# Patient Record
Sex: Male | Born: 1985
Health system: Southern US, Community
[De-identification: ages and names within clinical notes are randomized; demographics above are authoritative.]

## PROBLEM LIST (undated history)

## (undated) DIAGNOSIS — J392 Other diseases of pharynx: Secondary | ICD-10-CM

## (undated) DIAGNOSIS — F17200 Nicotine dependence, unspecified, uncomplicated: Secondary | ICD-10-CM

## (undated) DIAGNOSIS — E559 Vitamin D deficiency, unspecified: Secondary | ICD-10-CM

## (undated) DIAGNOSIS — L0592 Pilonidal sinus without abscess: Secondary | ICD-10-CM

## (undated) DIAGNOSIS — Z8639 Personal history of other endocrine, nutritional and metabolic disease: Secondary | ICD-10-CM

## (undated) DIAGNOSIS — L709 Acne, unspecified: Secondary | ICD-10-CM

## (undated) DIAGNOSIS — E213 Hyperparathyroidism, unspecified: Secondary | ICD-10-CM

## (undated) DIAGNOSIS — S0993XA Unspecified injury of face, initial encounter: Secondary | ICD-10-CM

## (undated) HISTORY — DX: Nicotine dependence, unspecified, uncomplicated: F17.200

## (undated) HISTORY — DX: Hyperparathyroidism, unspecified: E21.3

## (undated) HISTORY — PX: NO PAST SURGERIES: SHX2092

---

## 2013-02-16 ENCOUNTER — Ambulatory Visit: Payer: Medicaid Other | Attending: Internal Medicine | Admitting: Internal Medicine

## 2013-02-16 ENCOUNTER — Encounter: Payer: Self-pay | Admitting: Internal Medicine

## 2013-02-16 VITALS — BP 140/90 | HR 82 | Temp 98.2°F | Resp 17 | Wt 259.8 lb

## 2013-02-16 DIAGNOSIS — J3489 Other specified disorders of nose and nasal sinuses: Secondary | ICD-10-CM

## 2013-02-16 DIAGNOSIS — Z139 Encounter for screening, unspecified: Secondary | ICD-10-CM

## 2013-02-16 DIAGNOSIS — IMO0001 Reserved for inherently not codable concepts without codable children: Secondary | ICD-10-CM

## 2013-02-16 DIAGNOSIS — L709 Acne, unspecified: Secondary | ICD-10-CM

## 2013-02-16 DIAGNOSIS — L708 Other acne: Secondary | ICD-10-CM

## 2013-02-16 DIAGNOSIS — Z23 Encounter for immunization: Secondary | ICD-10-CM

## 2013-02-16 DIAGNOSIS — R030 Elevated blood-pressure reading, without diagnosis of hypertension: Secondary | ICD-10-CM | POA: Insufficient documentation

## 2013-02-16 DIAGNOSIS — R05 Cough: Secondary | ICD-10-CM

## 2013-02-16 DIAGNOSIS — R0981 Nasal congestion: Secondary | ICD-10-CM | POA: Insufficient documentation

## 2013-02-16 DIAGNOSIS — R059 Cough, unspecified: Secondary | ICD-10-CM

## 2013-02-16 MED ORDER — CLINDAMYCIN PHOS-BENZOYL PEROX 1-5 % EX GEL: Freq: Two times a day (BID) | CUTANEOUS | Status: DC

## 2013-02-16 MED ORDER — BENZONATATE 100 MG PO CAPS: 100.0000 mg | ORAL_CAPSULE | Freq: Three times a day (TID) | ORAL | Status: DC | PRN

## 2013-02-16 MED ORDER — FLUTICASONE PROPIONATE 50 MCG/ACT NA SUSP: 2.0000 | Freq: Every day | NASAL | Status: DC

## 2013-02-16 MED ORDER — CLINDAMYCIN PHOS-BENZOYL PEROX 1.2-5 % EX GEL: CUTANEOUS | Status: DC

## 2013-02-16 NOTE — Progress Notes (Signed)
Patient here to establish care Takes no prescribed medications Complains of having acne like rash on his face,top of His head and on his torso. Some of them are painful at times Red and inflamed on his back Currently smokes but is using the patch to try to quit

## 2013-02-16 NOTE — Progress Notes (Signed)
MRN: [id number] Name: Larry Hayden  Sex: male Age: 27 y.o. DOB: 1985/03/23  Allergies: Review of patient's allergies indicates no known allergies.  Chief Complaint  Patient presents with  . new patient    HPI: Patient is 27 y.o. male who comes for the first time to establish medical care, patient reported to have a lot of nasal congestion postnasal drip minimal productive cough, denies any fever chills chest pain or shortness of breath, was a smoker nor using nicotine patch and trying to quit, patient reported to have lot of acne spots on the face.  History reviewed. No pertinent past medical history.  History reviewed. No pertinent past surgical history.    Medication List       This list is accurate as of: 02/16/13 11:41 AM.  Always use your most recent med list.               benzonatate 100 MG capsule  Commonly known as:  TESSALON  Take 1 capsule (100 mg total) by mouth 3 (three) times daily as needed for cough.     Clindamycin-Benzoyl Per (Refr) gel  Apply topically 2 times a day     fluticasone 50 MCG/ACT nasal spray  Commonly known as:  FLONASE  Place 2 sprays into both nostrils daily.        Meds ordered this encounter  Medications  . Clindamycin-Benzoyl Per, Refr, gel    Sig: Apply topically 2 times a day    Dispense:  45 g    Refill:  1  . benzonatate (TESSALON) 100 MG capsule    Sig: Take 1 capsule (100 mg total) by mouth 3 (three) times daily as needed for cough.    Dispense:  30 capsule    Refill:  1  . fluticasone (FLONASE) 50 MCG/ACT nasal spray    Sig: Place 2 sprays into both nostrils daily.    Dispense:  16 g    Refill:  1    Immunization History  Administered Date(s) Administered  . Influenza,inj,Quad PF,36+ Mos 02/16/2013    History  Substance Use Topics  . Smoking status: Current Every Day Smoker    Types: Cigarettes  . Smokeless tobacco: Not on file  . Alcohol Use: No    Review of Systems  As noted in HPI  Filed  Vitals:   02/16/13 1129  BP: 140/90  Pulse:   Temp:   Resp:     Physical Exam  Physical Exam  Constitutional: He is oriented to person, place, and time. No distress.  HENT:  Nasal congestion no sinus tenderness, minimal pharyngeal erythema no exudate   Acne spots on the face erythematous no apparent discharge  Cardiovascular: Normal rate and regular rhythm.   Pulmonary/Chest: Breath sounds normal. No respiratory distress. He has no wheezes. He has no rales.  Neurological: He is alert and oriented to person, place, and time.    CBC No results found for this basename: wbc,  rbc,  hgb,  hct,  plt,  mcv,  neutrabs,  lymphsabs,  monoabs,  eosabs,  basosabs    CMP  No results found for this basename: na,  k,  cl,  co2,  glucose,  bun,  creatinine,  calcium,  prot,  albumin,  ast,  alt,  alkphos,  bilitot,  gfrnonaa,  gfraa    No results found for this basename: chol,  tri,  ldl    No components found with this basename: hga1c    No results found for this  basename: AST    Assessment and Plan  Elevated BP   manual BP is 140/90, I have advised patient for low salt diet and exercise to lose weight will check on following visit if persistently elevated consider starting on blood pressure medication.   flu shot given today  Screening - Plan: CBC with Differential, COMPLETE METABOLIC PANEL WITH GFR, Lipid panel, TSH, Vit D  25 hydroxy (rtn osteoporosis monitoring)  Acne - Plan: Clindamycin-Benzoyl Per, Refr, gel  Stuffy nose - Plan: fluticasone (FLONASE) 50 MCG/ACT nasal spray  Cough - Plan: benzonatate (TESSALON) 100 MG capsule. Advised patient for saltwater gargles    Health Maintenance Flu shot given today  Return in about 6 weeks (around 03/30/2013).  Doris Cheadle, MD

## 2013-02-16 NOTE — Patient Instructions (Signed)

## 2013-03-08 DIAGNOSIS — Z8639 Personal history of other endocrine, nutritional and metabolic disease: Secondary | ICD-10-CM

## 2013-03-08 HISTORY — DX: Personal history of other endocrine, nutritional and metabolic disease: Z86.39

## 2013-03-26 ENCOUNTER — Other Ambulatory Visit: Payer: Self-pay

## 2013-03-27 ENCOUNTER — Ambulatory Visit: Payer: Medicaid Other | Attending: Internal Medicine

## 2013-03-27 DIAGNOSIS — Z139 Encounter for screening, unspecified: Secondary | ICD-10-CM

## 2013-03-27 LAB — LIPID PANEL
CHOL/HDL RATIO: 6.8 ratio
CHOLESTEROL: 210 mg/dL — AB (ref 0–200)
HDL: 31 mg/dL — AB (ref >39)
LDL CALC: 150 mg/dL — AB (ref 0–99)
Triglycerides: 146 mg/dL (ref <150)
VLDL: 29 mg/dL (ref 0–40)

## 2013-03-27 LAB — CBC WITH DIFFERENTIAL/PLATELET
BASOS ABS: 0.1 10*3/uL (ref 0.0–0.1)
Basophils Relative: 1 % (ref 0–1)
Eosinophils Absolute: 0.2 10*3/uL (ref 0.0–0.7)
Eosinophils Relative: 2 % (ref 0–5)
HCT: 47 % (ref 39.0–52.0)
Hemoglobin: 16.2 g/dL (ref 13.0–17.0)
LYMPHS PCT: 30 % (ref 12–46)
Lymphs Abs: 2.4 10*3/uL (ref 0.7–4.0)
MCH: 27.9 pg (ref 26.0–34.0)
MCHC: 34.5 g/dL (ref 30.0–36.0)
MCV: 81 fL (ref 78.0–100.0)
Monocytes Absolute: 0.6 10*3/uL (ref 0.1–1.0)
Monocytes Relative: 8 % (ref 3–12)
NEUTROS PCT: 59 % (ref 43–77)
Neutro Abs: 4.9 10*3/uL (ref 1.7–7.7)
PLATELETS: 254 10*3/uL (ref 150–400)
RBC: 5.8 MIL/uL (ref 4.22–5.81)
RDW: 13.8 % (ref 11.5–15.5)
WBC: 8.2 10*3/uL (ref 4.0–10.5)

## 2013-03-27 LAB — COMPLETE METABOLIC PANEL WITH GFR
ALK PHOS: 67 U/L (ref 39–117)
ALT: 61 U/L — AB (ref 0–53)
AST: 24 U/L (ref 0–37)
Albumin: 4.5 g/dL (ref 3.5–5.2)
BILIRUBIN TOTAL: 0.9 mg/dL (ref 0.3–1.2)
BUN: 14 mg/dL (ref 6–23)
CO2: 28 mEq/L (ref 19–32)
CREATININE: 0.87 mg/dL (ref 0.50–1.35)
Calcium: 10.7 mg/dL — ABNORMAL HIGH (ref 8.4–10.5)
Chloride: 103 mEq/L (ref 96–112)
GFR, Est African American: >89 mL/min
GFR, Est Non African American: >89 mL/min
Glucose, Bld: 99 mg/dL (ref 70–99)
Potassium: 4.1 mEq/L (ref 3.5–5.3)
Sodium: 135 mEq/L (ref 135–145)
Total Protein: 6.9 g/dL (ref 6.0–8.3)

## 2013-03-27 LAB — TSH: TSH: 2.16 u[IU]/mL (ref 0.350–4.500)

## 2013-03-28 ENCOUNTER — Telehealth: Payer: Self-pay | Admitting: *Deleted

## 2013-03-28 ENCOUNTER — Telehealth: Payer: Self-pay

## 2013-03-28 LAB — VITAMIN D 25 HYDROXY (VIT D DEFICIENCY, FRACTURES): Vit D, 25-Hydroxy: 21 ng/mL — ABNORMAL LOW (ref 30–89)

## 2013-03-28 MED ORDER — VITAMIN D (ERGOCALCIFEROL) 1.25 MG (50000 UNIT) PO CAPS: 50000.0000 [IU] | ORAL_CAPSULE | ORAL | Status: DC

## 2013-03-28 NOTE — Telephone Encounter (Signed)
Message copied by Lestine MountJUAREZ, Izael Bessinger L on Wed Mar 28, 2013  3:16 PM ------      Message from: Doris CheadleADVANI, DEEPAK      Created: Wed Mar 28, 2013 10:26 AM       Blood work reviewed, noticed low vitamin D, call patient advise to start ergocalciferol 50,000 units once a week for the duration of  12 weeks.      Blood work reviewed, noticed elevated cholesterol, advise patient for low fat diet.       ------

## 2013-03-28 NOTE — Telephone Encounter (Signed)
Pt complains of weakness in the body and severe headaches causing pt to be unable to open his eyes. Has an appointment scheduled for 03/30/13.

## 2013-03-28 NOTE — Telephone Encounter (Signed)
Message copied by Catie Chiao, [place] R on Wed Mar 28, 2013 10:43 AM ------      Message from: Doris CheadleADVANI, DEEPAK      Created: Wed Mar 28, 2013 10:26 AM       Blood work reviewed, noticed low vitamin D, call patient advise to start ergocalciferol 50,000 units once a week for the duration of  12 weeks.      Blood work reviewed, noticed elevated cholesterol, advise patient for low fat diet.       ------

## 2013-03-28 NOTE — Telephone Encounter (Signed)
Interpreter line used Patient is aware of his lab results [facility]

## 2013-03-30 ENCOUNTER — Other Ambulatory Visit: Payer: Self-pay

## 2013-03-30 ENCOUNTER — Ambulatory Visit: Payer: Medicaid Other | Attending: Internal Medicine | Admitting: Internal Medicine

## 2013-03-30 ENCOUNTER — Encounter: Payer: Self-pay | Admitting: Internal Medicine

## 2013-03-30 VITALS — BP 130/89 | HR 90 | Temp 98.3°F | Resp 16

## 2013-03-30 DIAGNOSIS — R059 Cough, unspecified: Secondary | ICD-10-CM

## 2013-03-30 DIAGNOSIS — L709 Acne, unspecified: Secondary | ICD-10-CM

## 2013-03-30 DIAGNOSIS — E785 Hyperlipidemia, unspecified: Secondary | ICD-10-CM

## 2013-03-30 DIAGNOSIS — J069 Acute upper respiratory infection, unspecified: Secondary | ICD-10-CM

## 2013-03-30 DIAGNOSIS — R030 Elevated blood-pressure reading, without diagnosis of hypertension: Secondary | ICD-10-CM

## 2013-03-30 DIAGNOSIS — F17200 Nicotine dependence, unspecified, uncomplicated: Secondary | ICD-10-CM

## 2013-03-30 DIAGNOSIS — IMO0001 Reserved for inherently not codable concepts without codable children: Secondary | ICD-10-CM

## 2013-03-30 DIAGNOSIS — L708 Other acne: Secondary | ICD-10-CM

## 2013-03-30 DIAGNOSIS — R05 Cough: Secondary | ICD-10-CM

## 2013-03-30 DIAGNOSIS — E559 Vitamin D deficiency, unspecified: Secondary | ICD-10-CM

## 2013-03-30 MED ORDER — BENZONATATE 100 MG PO CAPS: 100.0000 mg | ORAL_CAPSULE | Freq: Three times a day (TID) | ORAL | Status: DC | PRN

## 2013-03-30 MED ORDER — AZITHROMYCIN 250 MG PO TABS: ORAL_TABLET | ORAL | Status: DC

## 2013-03-30 MED ORDER — NICOTINE POLACRILEX 4 MG MT GUM: 4.0000 mg | CHEWING_GUM | OROMUCOSAL | Status: DC | PRN

## 2013-03-30 MED ORDER — CLINDAMYCIN PHOS-BENZOYL PEROX 1-5 % EX GEL: Freq: Two times a day (BID) | CUTANEOUS | Status: DC

## 2013-03-30 NOTE — Progress Notes (Signed)
Patient here with interpreter Follow up from his acne

## 2013-03-30 NOTE — Progress Notes (Signed)
MRN: [id number] Name: Larry Hayden  Sex: male Age: 28 y.o. DOB: September 07, 1985  Allergies: Review of patient's allergies indicates no known allergies.  Chief Complaint  Patient presents with  . Follow-up    HPI: Patient is 28 y.o. male who comes today for followup, had a blood work done which was reviewed with the patient noticed low vitamin D and hyperlipidemia, patient has already been prescribed vitamin D supplement which he is going to start, has history of acne has been applying her Benzaclin, reports improvement and is requesting refill on the medication, also patient has been having ongoing nasal congestion dry cough, headache, denies any chest pain or shortness of breath, still smokes cigarettes, could not tolerate nicotine patch requesting nicotine gum prescription. Patient denies any fever chills.  History reviewed. No pertinent past medical history.  History reviewed. No pertinent past surgical history.    Medication List       This list is accurate as of: 03/30/13 11:00 AM.  Always use your most recent med list.               azithromycin 250 MG tablet  Commonly known as:  ZITHROMAX Z-PAK  Take as directed     benzonatate 100 MG capsule  Commonly known as:  TESSALON  Take 1 capsule (100 mg total) by mouth 3 (three) times daily as needed for cough.     clindamycin-benzoyl peroxide gel  Commonly known as:  BENZACLIN  Apply topically 2 (two) times daily.     fluticasone 50 MCG/ACT nasal spray  Commonly known as:  FLONASE  Place 2 sprays into both nostrils daily.     nicotine polacrilex 4 MG gum  Commonly known as:  NICORETTE  Take 1 each (4 mg total) by mouth as needed for smoking cessation.     Vitamin D (Ergocalciferol) 50000 UNITS Caps capsule  Commonly known as:  DRISDOL  Take 1 capsule (50,000 Units total) by mouth every 7 (seven) days.        Meds ordered this encounter  Medications  . azithromycin (ZITHROMAX Z-PAK) 250 MG tablet    Sig: Take as  directed    Dispense:  6 each    Refill:  0  . nicotine polacrilex (NICORETTE) 4 MG gum    Sig: Take 1 each (4 mg total) by mouth as needed for smoking cessation.    Dispense:  100 tablet    Refill:  0  . clindamycin-benzoyl peroxide (BENZACLIN) gel    Sig: Apply topically 2 (two) times daily.    Dispense:  50 g    Refill:  2  . benzonatate (TESSALON) 100 MG capsule    Sig: Take 1 capsule (100 mg total) by mouth 3 (three) times daily as needed for cough.    Dispense:  30 capsule    Refill:  1    Immunization History  Administered Date(s) Administered  . Influenza,inj,Quad PF,36+ Mos 02/16/2013    Family History  Problem Relation Age of Onset  . Cancer Mother   . Cancer Maternal Uncle   . Heart disease Maternal Grandmother     History  Substance Use Topics  . Smoking status: Current Every Day Smoker    Types: Cigarettes  . Smokeless tobacco: Not on file  . Alcohol Use: No    Review of Systems  As noted in HPI  Filed Vitals:   03/30/13 1046  BP: 130/89  Pulse: 90  Temp: 98.3 F (36.8 C)  Resp: 16  Physical Exam  Physical Exam  HENT:  Nasal congestion minimal sinus tenderness  Eyes: EOM are normal. Pupils are equal, round, and reactive to light.  Cardiovascular: Normal rate and regular rhythm.   Pulmonary/Chest: Breath sounds normal. No respiratory distress. He has no wheezes. He has no rales.  Musculoskeletal: He exhibits no edema.  Lymphadenopathy:    He has cervical adenopathy.  Skin:  spots on the face erythematous no apparent discharge      CBC    Component Value Date/Time   WBC 8.2 03/27/2013 1130   RBC 5.80 03/27/2013 1130   HGB 16.2 03/27/2013 1130   HCT 47.0 03/27/2013 1130   PLT 254 03/27/2013 1130   MCV 81.0 03/27/2013 1130   LYMPHSABS 2.4 03/27/2013 1130   MONOABS 0.6 03/27/2013 1130   EOSABS 0.2 03/27/2013 1130   BASOSABS 0.1 03/27/2013 1130    CMP     Component Value Date/Time   NA 135 03/27/2013 1130   K 4.1 03/27/2013 1130   CL  103 03/27/2013 1130   CO2 28 03/27/2013 1130   GLUCOSE 99 03/27/2013 1130   BUN 14 03/27/2013 1130   CREATININE 0.87 03/27/2013 1130   CALCIUM 10.7* 03/27/2013 1130   PROT 6.9 03/27/2013 1130   ALBUMIN 4.5 03/27/2013 1130   AST 24 03/27/2013 1130   ALT 61* 03/27/2013 1130   ALKPHOS 67 03/27/2013 1130   BILITOT 0.9 03/27/2013 1130    Lab Results  Component Value Date/Time   CHOL 210* 03/27/2013 11:30 AM    No components found with this basename: hga1c    Lab Results  Component Value Date/Time   AST 24 03/27/2013 11:30 AM    Assessment and Plan  Elevated BP  Blood pressure is improved compared to last visit continue with low salt diet.  Acne - Plan: clindamycin-benzoyl peroxide (BENZACLIN) gel  Unspecified vitamin D deficiency Vitamin D 50,000 units q. Weekly.  Other and unspecified hyperlipidemia Advised patient for low fat diet.  URI (upper respiratory infection) - Plan: azithromycin (ZITHROMAX Z-PAK) 250 MG tablet Also advised for salt water gargles  Cough - Plan: benzonatate (TESSALON) 100 MG capsule  Smoking - Plan: nicotine polacrilex (NICORETTE) 4 MG gum    Return in about 3 months (around 06/28/2013).  Doris Cheadle, MD

## 2013-04-13 ENCOUNTER — Encounter (HOSPITAL_COMMUNITY): Payer: Self-pay | Admitting: Emergency Medicine

## 2013-04-13 ENCOUNTER — Emergency Department (HOSPITAL_COMMUNITY)
Admission: EM | Admit: 2013-04-13 | Discharge: 2013-04-13 | Disposition: A | Discharge status: Home / Self Care | Payer: Medicaid Other | Attending: Emergency Medicine | Admitting: Emergency Medicine

## 2013-04-13 DIAGNOSIS — Z792 Long term (current) use of antibiotics: Secondary | ICD-10-CM | POA: Insufficient documentation

## 2013-04-13 DIAGNOSIS — L03317 Cellulitis of buttock: Principal | ICD-10-CM

## 2013-04-13 DIAGNOSIS — IMO0002 Reserved for concepts with insufficient information to code with codable children: Secondary | ICD-10-CM | POA: Insufficient documentation

## 2013-04-13 DIAGNOSIS — Z79899 Other long term (current) drug therapy: Secondary | ICD-10-CM | POA: Insufficient documentation

## 2013-04-13 DIAGNOSIS — F17200 Nicotine dependence, unspecified, uncomplicated: Secondary | ICD-10-CM | POA: Insufficient documentation

## 2013-04-13 DIAGNOSIS — L0231 Cutaneous abscess of buttock: Secondary | ICD-10-CM | POA: Insufficient documentation

## 2013-04-13 MED ORDER — CEPHALEXIN 500 MG PO CAPS: 500.0000 mg | ORAL_CAPSULE | Freq: Four times a day (QID) | ORAL | Status: DC

## 2013-04-13 MED ORDER — SULFAMETHOXAZOLE-TRIMETHOPRIM 800-160 MG PO TABS: 1.0000 | ORAL_TABLET | Freq: Two times a day (BID) | ORAL | Status: AC

## 2013-04-13 NOTE — Discharge Instructions (Signed)
.         .             .          .        .      Marland Kitchen                   .                .    Marland Kitchen                   Marland Kitchen                    .              .     (  )            .                   Marland Kitchen               .              . :         .           .         (  )     .               Marland Kitchen   CARE                      .           Marland Kitchen          Marland Kitchen             .    :        .    .           .     .         .          .    :             .           .   .   :   .   .              .   : 2004/12/02   : 2011/08/24    : 2011/05/07 ExitCare    2014   Take antibiotic to completion. Followup with your primary care physician on Monday.   Abscess An abscess is an infected area that contains a collection of pus and debris.It can occur in almost any part of the body. An abscess is also known as a furuncle or boil. CAUSES  An abscess occurs when tissue gets infected. This can occur from blockage of oil or sweat glands, infection of hair follicles, or a minor injury to the skin. As the body tries to fight the infection, pus collects in the area and creates pressure under the skin. This pressure causes pain. People with weakened immune systems have difficulty fighting infections and get certain abscesses more often.  SYMPTOMS Usually an abscess develops on the skin and becomes a painful mass that is red, warm, and tender. If the abscess forms under the skin, you may feel a moveable soft area under the skin. Some  abscesses break open (rupture) on their own, but most will continue to get worse without care. The infection can spread deeper into the body and eventually into the bloodstream, causing you to feel ill.  DIAGNOSIS  Your caregiver will take your medical history and perform a physical exam. A sample of fluid may also be taken from the abscess to determine what is causing your infection. TREATMENT  Your caregiver may prescribe antibiotic medicines to fight the infection. However, taking antibiotics alone usually does not cure an abscess. Your caregiver may need to make a small cut (incision) in the abscess to drain the pus. In  some cases, gauze is packed into the abscess to reduce pain and to continue draining the area. HOME CARE INSTRUCTIONS   Only take over-the-counter or prescription medicines for pain, discomfort, or fever as directed by your caregiver.  If you were prescribed antibiotics, take them as directed. Finish them even if you start to feel better.  If gauze is used, follow your caregiver's directions for changing the gauze.  To avoid spreading the infection:  Keep your draining abscess covered with a bandage.  Wash your hands well.  Do not share personal care items, towels, or whirlpools with others.  Avoid skin contact with others.  Keep your skin and clothes clean around the abscess.  Keep all follow-up appointments as directed by your caregiver. SEEK MEDICAL CARE IF:   You have increased pain, swelling, redness, fluid drainage, or bleeding.  You have muscle aches, chills, or a general ill feeling.  You have a fever. MAKE SURE YOU:   Understand these instructions.  Will watch your condition.  Will get help right away if you are not doing well or get worse. Document Released: 12/02/2004 Document Revised: 08/24/2011 Document Reviewed: 05/07/2011 [facility] Patient Information 2014 [place], [place].

## 2013-04-13 NOTE — ED Notes (Signed)
Small abscess to left medial buttock x 3 days, no drainage noted.

## 2013-04-13 NOTE — ED Provider Notes (Signed)
CSN: [id number]     Arrival date & time 04/13/13  1406 History  This chart was scribed for Johnnette Gourdobyn Albert, non-physician practitioner, working with Layla MawKristen N Ward, DO by Bennett Scrapehristina Taylor, ED Scribe. This patient was seen in room TR11C/TR11C and the patient's care was started at 2:38 PM.    Chief Complaint  Patient presents with  . Abscess    The history is provided by the patient and a relative (sister translated in Arabic in the room). No language interpreter was used.    HPI Comments: Larry Hayden is a 28 y.o. male who presents to the Emergency Department complaining of an abscess to the top of the left buttocks area that has been worsening since its onset 3 days ago. He reports associated pain and warmth to the area. He denies any recent fevers or drainage.    History reviewed. No pertinent past medical history. History reviewed. No pertinent past surgical history. Family History  Problem Relation Age of Onset  . Cancer Mother   . Cancer Maternal Uncle   . Heart disease Maternal Grandmother    History  Substance Use Topics  . Smoking status: Current Every Day Smoker    Types: Cigarettes  . Smokeless tobacco: Not on file  . Alcohol Use: No    Review of Systems  A complete 10 system review of systems was obtained and all systems are negative except as noted in the HPI and PMH.   Allergies  Review of patient's allergies indicates no known allergies.  Home Medications   Current Outpatient Rx  Name  Route  Sig  Dispense  Refill  . azithromycin (ZITHROMAX Z-PAK) 250 MG tablet      Take as directed   6 each   0   . benzonatate (TESSALON) 100 MG capsule   Oral   Take 1 capsule (100 mg total) by mouth 3 (three) times daily as needed for cough.   30 capsule   1   . cephALEXin (KEFLEX) 500 MG capsule   Oral   Take 1 capsule (500 mg total) by mouth 4 (four) times daily.   28 capsule   0   . clindamycin-benzoyl peroxide (BENZACLIN) gel   Topical   Apply topically  2 (two) times daily.   50 g   2   . fluticasone (FLONASE) 50 MCG/ACT nasal spray   Each Nare   Place 2 sprays into both nostrils daily.   16 g   1   . nicotine polacrilex (NICORETTE) 4 MG gum   Oral   Take 1 each (4 mg total) by mouth as needed for smoking cessation.   100 tablet   0   . sulfamethoxazole-trimethoprim (BACTRIM DS,SEPTRA DS) 800-160 MG per tablet   Oral   Take 1 tablet by mouth 2 (two) times daily.   14 tablet   0   . Vitamin D, Ergocalciferol, (DRISDOL) 50000 UNITS CAPS capsule   Oral   Take 1 capsule (50,000 Units total) by mouth every 7 (seven) days.   30 capsule   0    Triage Vitals: BP 148/83  Pulse 82  Temp(Src) 97.9 F (36.6 C) (Oral)  Resp 20  SpO2 96%  Physical Exam  Nursing note and vitals reviewed. Constitutional: He is oriented to person, place, and time. He appears well-developed and well-nourished. No distress.  HENT:  Head: Normocephalic and atraumatic.  Eyes: EOM are normal.  Neck: Neck supple. No tracheal deviation present.  Cardiovascular: Normal rate.   Pulmonary/Chest:  Effort normal. No respiratory distress.  Genitourinary:  2 cm area of induration, erythema and warmth to the top of the left buttocks. No drainage. Chaperone (scribe) present  Musculoskeletal: Normal range of motion.  Neurological: He is alert and oriented to person, place, and time.  Skin: Skin is warm and dry.  Psychiatric: He has a normal mood and affect. His behavior is normal.    ED Course  Procedures (including critical care time)  DIAGNOSTIC STUDIES: Oxygen Saturation is 96% on RA, adequate by my interpretation.    COORDINATION OF CARE: 2:39 PM-Discussed treatment plan which includes incision and drainage with pt at bedside and pt agreed to plan.   INCISION AND DRAINAGE PROCEDURE NOTE: Patient identification was confirmed and verbal consent was obtained. This procedure was performed by Johnnette Gourd, PA-C at 2:40 PM. Site: left gluteal  crease Sterile procedures observed Needle size: 27 gauge  Anesthetic used (type and amt): 1.5 mL of 1% lidocaine with epi Blade size: 11  Drainage: copious amount of purulent drainage Complexity: Complex No Packing used Site anesthetized, incision made over site, wound drained and explored loculations, rinsed with copious amounts of normal saline, wound packed with sterile gauze, covered with dry, sterile dressing.  Pt tolerated procedure well without complications.  Instructions for care discussed verbally and pt provided with additional written instructions for homecare and f/u. Pt will be started on an antibiotic and advised to have the area checked in 2 days by his PCP.   Labs Review Labs Reviewed - No data to display Imaging Review No results found.  EKG Interpretation   None       MDM   1. Abscess of buttock, left    Pt well appearing, afebrile, VSS. Abscess drained, mild surrounding cellulitis. Will tx with abx. F/u with PCP. Return precautions given. Patient states understanding of treatment care plan and is agreeable.   I personally performed the services described in this documentation, which was scribed in my presence. The recorded information has been reviewed and is accurate.    Trevor Mace, PA-C 04/13/13 1453

## 2013-04-13 NOTE — ED Provider Notes (Signed)
Medical screening examination/treatment/procedure(s) were performed by non-physician practitioner and as supervising physician I was immediately available for consultation/collaboration.  EKG Interpretation   None         Kristen N Ward, DO 04/13/13 1454 

## 2013-04-13 NOTE — ED Notes (Signed)
Pt in c/o abscess to top of buttocks area, states it has been there x3 days, denies drainage, area warm to touch

## 2013-04-23 ENCOUNTER — Ambulatory Visit: Payer: Medicaid Other | Admitting: Internal Medicine

## 2013-05-01 ENCOUNTER — Ambulatory Visit: Payer: Medicaid Other | Admitting: Internal Medicine

## 2013-05-11 ENCOUNTER — Ambulatory Visit: Payer: Medicaid Other | Attending: Internal Medicine | Admitting: Internal Medicine

## 2013-05-11 ENCOUNTER — Encounter: Payer: Self-pay | Admitting: Internal Medicine

## 2013-05-11 VITALS — BP 129/84 | HR 79 | Temp 99.0°F | Resp 16 | Wt 257.0 lb

## 2013-05-11 DIAGNOSIS — M62830 Muscle spasm of back: Secondary | ICD-10-CM

## 2013-05-11 DIAGNOSIS — J3489 Other specified disorders of nose and nasal sinuses: Secondary | ICD-10-CM

## 2013-05-11 DIAGNOSIS — Z79899 Other long term (current) drug therapy: Secondary | ICD-10-CM | POA: Insufficient documentation

## 2013-05-11 DIAGNOSIS — Z09 Encounter for follow-up examination after completed treatment for conditions other than malignant neoplasm: Secondary | ICD-10-CM | POA: Insufficient documentation

## 2013-05-11 DIAGNOSIS — J069 Acute upper respiratory infection, unspecified: Secondary | ICD-10-CM

## 2013-05-11 DIAGNOSIS — R0981 Nasal congestion: Secondary | ICD-10-CM

## 2013-05-11 DIAGNOSIS — F17200 Nicotine dependence, unspecified, uncomplicated: Secondary | ICD-10-CM | POA: Insufficient documentation

## 2013-05-11 DIAGNOSIS — L0231 Cutaneous abscess of buttock: Secondary | ICD-10-CM | POA: Insufficient documentation

## 2013-05-11 DIAGNOSIS — M5380 Other specified dorsopathies, site unspecified: Secondary | ICD-10-CM | POA: Insufficient documentation

## 2013-05-11 DIAGNOSIS — L03317 Cellulitis of buttock: Secondary | ICD-10-CM

## 2013-05-11 DIAGNOSIS — R05 Cough: Secondary | ICD-10-CM

## 2013-05-11 DIAGNOSIS — R059 Cough, unspecified: Secondary | ICD-10-CM

## 2013-05-11 MED ORDER — AMOXICILLIN-POT CLAVULANATE 875-125 MG PO TABS: 1.0000 | ORAL_TABLET | Freq: Two times a day (BID) | ORAL | Status: DC

## 2013-05-11 MED ORDER — CYCLOBENZAPRINE HCL 10 MG PO TABS: 10.0000 mg | ORAL_TABLET | Freq: Three times a day (TID) | ORAL | Status: DC | PRN

## 2013-05-11 MED ORDER — VARENICLINE TARTRATE 0.5 MG X 11 & 1 MG X 42 PO MISC: ORAL | Status: DC

## 2013-05-11 MED ORDER — FLUTICASONE PROPIONATE 50 MCG/ACT NA SUSP: 2.0000 | Freq: Every day | NASAL | Status: DC

## 2013-05-11 MED ORDER — BENZONATATE 100 MG PO CAPS: 100.0000 mg | ORAL_CAPSULE | Freq: Three times a day (TID) | ORAL | Status: DC | PRN

## 2013-05-11 NOTE — Progress Notes (Signed)
Patient here for follow up from ED Was seen for cyst to the buttocks area Complains of back spasms Tremors in arms Chest congestion  With some SOB Coughing up green mucous

## 2013-05-11 NOTE — Progress Notes (Signed)
MRN: [id number] Name: Larry Hayden  Sex: male Age: 28 y.o. DOB: Dec 23, 1985  Allergies: Review of patient's allergies indicates no known allergies.  Chief Complaint  Patient presents with  . Follow-up    HPI: Patient is 28 y.o. male who comes today for followup, last month he went to the emergency room, EMR reviewed he had abscess on the left buttock which was drained and patient was started on Keflex he took it for one week denies any more discharge has some discomfort around that area, he also reported to have lot of upper respiratory symptoms stuffy nose runny nose postnasal drip productive cough greenish in color, patient does smoke cigarettes has been trying to quit could not tolerate nicotine patch and gum, patient wants to quit smoking, he also reported to have lot of back pain, denies any fall or trauma.  History reviewed. No pertinent past medical history.  History reviewed. No pertinent past surgical history.    Medication List       This list is accurate as of: 05/11/13 10:19 AM.  Always use your most recent med list.               amoxicillin-clavulanate 875-125 MG per tablet  Commonly known as:  AUGMENTIN  Take 1 tablet by mouth 2 (two) times daily.     azithromycin 250 MG tablet  Commonly known as:  ZITHROMAX Z-PAK  Take as directed     benzonatate 100 MG capsule  Commonly known as:  TESSALON  Take 1 capsule (100 mg total) by mouth 3 (three) times daily as needed for cough.     cephALEXin 500 MG capsule  Commonly known as:  KEFLEX  Take 1 capsule (500 mg total) by mouth 4 (four) times daily.     clindamycin-benzoyl peroxide gel  Commonly known as:  BENZACLIN  Apply topically 2 (two) times daily.     cyclobenzaprine 10 MG tablet  Commonly known as:  FLEXERIL  Take 1 tablet (10 mg total) by mouth 3 (three) times daily as needed for muscle spasms.     fluticasone 50 MCG/ACT nasal spray  Commonly known as:  FLONASE  Place 2 sprays into both  nostrils daily.     nicotine polacrilex 4 MG gum  Commonly known as:  NICORETTE  Take 1 each (4 mg total) by mouth as needed for smoking cessation.     varenicline 0.5 MG X 11 & 1 MG X 42 tablet  Commonly known as:  CHANTIX STARTING MONTH PAK  Take one 0.5 mg tablet by mouth once daily for 3 days, then increase to one 0.5 mg tablet twice daily for 4 days, then increase to one 1 mg tablet twice daily.     Vitamin D (Ergocalciferol) 50000 UNITS Caps capsule  Commonly known as:  DRISDOL  Take 1 capsule (50,000 Units total) by mouth every 7 (seven) days.        Meds ordered this encounter  Medications  . amoxicillin-clavulanate (AUGMENTIN) 875-125 MG per tablet    Sig: Take 1 tablet by mouth 2 (two) times daily.    Dispense:  20 tablet    Refill:  0  . varenicline (CHANTIX STARTING MONTH PAK) 0.5 MG X 11 & 1 MG X 42 tablet    Sig: Take one 0.5 mg tablet by mouth once daily for 3 days, then increase to one 0.5 mg tablet twice daily for 4 days, then increase to one 1 mg tablet twice daily.  Dispense:  53 tablet    Refill:  0  . benzonatate (TESSALON) 100 MG capsule    Sig: Take 1 capsule (100 mg total) by mouth 3 (three) times daily as needed for cough.    Dispense:  30 capsule    Refill:  1  . fluticasone (FLONASE) 50 MCG/ACT nasal spray    Sig: Place 2 sprays into both nostrils daily.    Dispense:  16 g    Refill:  1  . cyclobenzaprine (FLEXERIL) 10 MG tablet    Sig: Take 1 tablet (10 mg total) by mouth 3 (three) times daily as needed for muscle spasms.    Dispense:  30 tablet    Refill:  3    Immunization History  Administered Date(s) Administered  . Influenza,inj,Quad PF,36+ Mos 02/16/2013    Family History  Problem Relation Age of Onset  . Cancer Mother   . Cancer Maternal Uncle   . Heart disease Maternal Grandmother     History  Substance Use Topics  . Smoking status: Current Every Day Smoker    Types: Cigarettes  . Smokeless tobacco: Not on file  . Alcohol  Use: No    Review of Systems   As noted in HPI  Filed Vitals:   05/11/13 0945  BP: 129/84  Pulse: 79  Temp: 99 F (37.2 C)  Resp: 16    Physical Exam  Physical Exam  HENT:  Nasal congestion ? Polyp , minimal pharyngeal erythema no exudate   Eyes: EOM are normal. Pupils are equal, round, and reactive to light.  Cardiovascular: Normal rate and regular rhythm.   Pulmonary/Chest: Breath sounds normal. No respiratory distress. He has no wheezes. He has no rales.  Musculoskeletal:  Lower paraspinal tenderness   Skin:  Left buttock incision site is clean non-tender no apparent discharge healing well    CBC    Component Value Date/Time   WBC 8.2 03/27/2013 1130   RBC 5.80 03/27/2013 1130   HGB 16.2 03/27/2013 1130   HCT 47.0 03/27/2013 1130   PLT 254 03/27/2013 1130   MCV 81.0 03/27/2013 1130   LYMPHSABS 2.4 03/27/2013 1130   MONOABS 0.6 03/27/2013 1130   EOSABS 0.2 03/27/2013 1130   BASOSABS 0.1 03/27/2013 1130    CMP     Component Value Date/Time   NA 135 03/27/2013 1130   K 4.1 03/27/2013 1130   CL 103 03/27/2013 1130   CO2 28 03/27/2013 1130   GLUCOSE 99 03/27/2013 1130   BUN 14 03/27/2013 1130   CREATININE 0.87 03/27/2013 1130   CALCIUM 10.7* 03/27/2013 1130   PROT 6.9 03/27/2013 1130   ALBUMIN 4.5 03/27/2013 1130   AST 24 03/27/2013 1130   ALT 61* 03/27/2013 1130   ALKPHOS 67 03/27/2013 1130   BILITOT 0.9 03/27/2013 1130    Lab Results  Component Value Date/Time   CHOL 210* 03/27/2013 11:30 AM    No components found with this basename: hga1c    Lab Results  Component Value Date/Time   AST 24 03/27/2013 11:30 AM    Assessment and Plan  Follow up  Abscess of buttock, left Incision and drainage done  in the ER  URI (upper respiratory infection) - Plan: amoxicillin-clavulanate (AUGMENTIN) 875-125 MG per tablet  Stuffy nose - Plan: fluticasone (FLONASE) 50 MCG/ACT nasal spray  Cough - Plan: benzonatate (TESSALON) 100 MG capsule  Back muscle spasm - Plan:  cyclobenzaprine (FLEXERIL) 10 MG tablet  Tobacco use disorder - Plan: varenicline (CHANTIX STARTING MONTH  PAK) 0.5 MG X 11 & 1 MG X 42 tablet   Return in about 3 months (around 08/11/2013).  Doris Cheadle, MD

## 2013-05-31 ENCOUNTER — Encounter (HOSPITAL_COMMUNITY): Payer: Self-pay | Admitting: Emergency Medicine

## 2013-05-31 ENCOUNTER — Emergency Department (HOSPITAL_COMMUNITY)
Admission: EM | Admit: 2013-05-31 | Discharge: 2013-05-31 | Disposition: A | Discharge status: Home / Self Care | Payer: Medicaid Other | Source: Home / Self Care | Attending: Emergency Medicine | Admitting: Emergency Medicine

## 2013-05-31 ENCOUNTER — Ambulatory Visit (HOSPITAL_COMMUNITY): Payer: Medicaid Other | Attending: Emergency Medicine

## 2013-05-31 DIAGNOSIS — L0231 Cutaneous abscess of buttock: Secondary | ICD-10-CM | POA: Insufficient documentation

## 2013-05-31 DIAGNOSIS — B9789 Other viral agents as the cause of diseases classified elsewhere: Secondary | ICD-10-CM

## 2013-05-31 DIAGNOSIS — L03317 Cellulitis of buttock: Secondary | ICD-10-CM

## 2013-05-31 DIAGNOSIS — M2550 Pain in unspecified joint: Secondary | ICD-10-CM

## 2013-05-31 DIAGNOSIS — M25649 Stiffness of unspecified hand, not elsewhere classified: Secondary | ICD-10-CM | POA: Insufficient documentation

## 2013-05-31 DIAGNOSIS — B349 Viral infection, unspecified: Secondary | ICD-10-CM

## 2013-05-31 DIAGNOSIS — M791 Myalgia, unspecified site: Secondary | ICD-10-CM

## 2013-05-31 DIAGNOSIS — F17200 Nicotine dependence, unspecified, uncomplicated: Secondary | ICD-10-CM | POA: Insufficient documentation

## 2013-05-31 LAB — CBC WITH DIFFERENTIAL/PLATELET
BASOS PCT: 1 % (ref 0–1)
Basophils Absolute: 0 10*3/uL (ref 0.0–0.1)
EOS ABS: 0.2 10*3/uL (ref 0.0–0.7)
EOS PCT: 3 % (ref 0–5)
HCT: 45.9 % (ref 39.0–52.0)
HEMOGLOBIN: 16.2 g/dL (ref 13.0–17.0)
Lymphocytes Relative: 33 % (ref 12–46)
Lymphs Abs: 2.8 10*3/uL (ref 0.7–4.0)
MCH: 29.3 pg (ref 26.0–34.0)
MCHC: 35.3 g/dL (ref 30.0–36.0)
MCV: 83.2 fL (ref 78.0–100.0)
MONOS PCT: 7 % (ref 3–12)
Monocytes Absolute: 0.6 10*3/uL (ref 0.1–1.0)
NEUTROS PCT: 57 % (ref 43–77)
Neutro Abs: 4.8 10*3/uL (ref 1.7–7.7)
Platelets: 245 10*3/uL (ref 150–400)
RBC: 5.52 MIL/uL (ref 4.22–5.81)
RDW: 12.6 % (ref 11.5–15.5)
WBC: 8.5 10*3/uL (ref 4.0–10.5)

## 2013-05-31 LAB — POCT I-STAT, CHEM 8
BUN: 15 mg/dL (ref 6–23)
CALCIUM ION: 1.4 mmol/L — AB (ref 1.12–1.23)
Chloride: 102 mEq/L (ref 96–112)
Creatinine, Ser: 1 mg/dL (ref 0.50–1.35)
GLUCOSE: 127 mg/dL — AB (ref 70–99)
HEMATOCRIT: 49 % (ref 39.0–52.0)
HEMOGLOBIN: 16.7 g/dL (ref 13.0–17.0)
Potassium: 3.7 mEq/L (ref 3.7–5.3)
Sodium: 141 mEq/L (ref 137–147)
TCO2: 27 mmol/L (ref 0–100)

## 2013-05-31 LAB — POCT URINALYSIS DIP (DEVICE)
Bilirubin Urine: NEGATIVE
Glucose, UA: NEGATIVE mg/dL
Ketones, ur: NEGATIVE mg/dL
LEUKOCYTES UA: NEGATIVE
NITRITE: NEGATIVE
PROTEIN: NEGATIVE mg/dL
Specific Gravity, Urine: 1.025 (ref 1.005–1.030)
UROBILINOGEN UA: 0.2 mg/dL (ref 0.0–1.0)
pH: 7 (ref 5.0–8.0)

## 2013-05-31 MED ORDER — HYDROCODONE-ACETAMINOPHEN 5-325 MG PO TABS: ORAL_TABLET | ORAL | Status: DC

## 2013-05-31 MED ORDER — DICLOFENAC SODIUM 75 MG PO TBEC: 75.0000 mg | DELAYED_RELEASE_TABLET | Freq: Two times a day (BID) | ORAL | Status: DC

## 2013-05-31 NOTE — ED Notes (Signed)
Muscle aching, and cramping, feverish, sweats, thirst feels like he is going to pass out, N,V and D

## 2013-05-31 NOTE — ED Provider Notes (Signed)
Chief Complaint   No chief complaint on file.   History of Present Illness   Remiel Corti is a 28 year old male who speaks Arabic, and therefore history was obtained with the help of a telephone interpreter. He presents tonight with a three-day history of many symptoms including generalized, all over myalgias, joint pain, back pain, headache, generalized weakness, lack of strength and power, difficulty sleeping at night, muscle spasm, subjective fever, sweats, excessive thirst, presyncope, nasal congestion, sore throat, cough, mid abdominal pain, diarrhea 2 days ago, and nausea. He denies any shortness of breath, vomiting, blood in the stool, or urinary symptoms. He's had no skin rash. 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 Sometimes this bothers his legs and his hands.  Review of Systems     Other than as noted above, the patient denies any of the following symptoms: Systemic:  No fever, chills, sweats, fatigue, myalgias, headache, or anorexia. Eye:  No redness, pain or drainage. ENT:  No earache, nasal congestion, rhinorrhea, sinus pressure, or sore throat. Lungs:  No cough, sputum production, wheezing, shortness of breath.  Cardiovascular:  No chest pain, palpitations, or syncope. GI:  No nausea, vomiting, abdominal pain or diarrhea. GU:  No dysuria, frequency, or hematuria. Skin:  No rash or pruritis.   PMFSH     Past medical history, family history, social history, meds, and allergies were reviewed.  He has no medication allergies. He takes Chantix, vitamin D, and ibuprofen. He's been told that his blood pressure is elevated.  Physical Examination    Vital signs:  BP 128/78  Pulse 88  Temp(Src) 98.8 F (37.1 C) (Oral)  Resp 16  SpO2 99% General:  Alert, in no distress. Eye:  PERRL, full EOMs.  Lids and conjunctivas were normal. ENT:  TMs and canals were normal, without erythema or  inflammation.  Nasal mucosa was clear and uncongested, without drainage.  Mucous membranes were moist.  Pharynx was clear, without exudate or drainage.  There were no oral ulcerations or lesions. Neck:  Supple, no adenopathy, tenderness or mass. Thyroid was normal. Lungs:  No respiratory distress.  Lungs were clear to auscultation, without wheezes, rales or rhonchi.  Breath sounds were clear and equal bilaterally. Heart:  Regular rhythm, without gallops, murmers or rubs. Abdomen:  Soft, flat, and non-tender to palpation.  No hepatosplenomagaly or mass. Extremities: No swelling or deformity. He has pain in all his muscle and joint areas to palpation, but full range of motion both actively and passively. Pulses are full. There is no edema. Skin:  Clear, warm, and dry, without rash or lesions.  Labs    Results for orders placed during the hospital encounter of 05/31/13  CBC WITH DIFFERENTIAL      Result Value Ref Range   WBC 8.5  4.0 - 10.5 K/uL   RBC 5.52  4.22 - 5.81 MIL/uL   Hemoglobin 16.2  13.0 - 17.0 g/dL   HCT 45.4  09.8 - 11.9 %   MCV 83.2  78.0 - 100.0 fL   MCH 29.3  26.0 - 34.0 pg   MCHC 35.3  30.0 - 36.0 g/dL   RDW 14.7  82.9 - 56.2 %   Platelets 245  150 - 400 K/uL   Neutrophils Relative % 57  43 - 77 %   Neutro Abs 4.8  1.7 - 7.7 K/uL   Lymphocytes Relative 33  12 - 46 %  Lymphs Abs 2.8  0.7 - 4.0 K/uL   Monocytes Relative 7  3 - 12 %   Monocytes Absolute 0.6  0.1 - 1.0 K/uL   Eosinophils Relative 3  0 - 5 %   Eosinophils Absolute 0.2  0.0 - 0.7 K/uL   Basophils Relative 1  0 - 1 %   Basophils Absolute 0.0  0.0 - 0.1 K/uL  POCT I-STAT, CHEM 8      Result Value Ref Range   Sodium 141  137 - 147 mEq/L   Potassium 3.7  3.7 - 5.3 mEq/L   Chloride 102  96 - 112 mEq/L   BUN 15  6 - 23 mg/dL   Creatinine, Ser 9.56  0.50 - 1.35 mg/dL   Glucose, Bld 213 (*) 70 - 99 mg/dL   Calcium, Ion 0.86 (*) 1.12 - 1.23 mmol/L   TCO2 27  0 - 100 mmol/L   Hemoglobin 16.7  13.0 - 17.0  g/dL   HCT 57.8  46.9 - 62.9 %  POCT URINALYSIS DIP (DEVICE)      Result Value Ref Range   Glucose, UA NEGATIVE  NEGATIVE mg/dL   Bilirubin Urine NEGATIVE  NEGATIVE   Ketones, ur NEGATIVE  NEGATIVE mg/dL   Specific Gravity, Urine 1.025  1.005 - 1.030   Hgb urine dipstick TRACE (*) NEGATIVE   pH 7.0  5.0 - 8.0   Protein, ur NEGATIVE  NEGATIVE mg/dL   Urobilinogen, UA 0.2  0.0 - 1.0 mg/dL   Nitrite NEGATIVE  NEGATIVE   Leukocytes, UA NEGATIVE  NEGATIVE     Radiology    Dg Abd Acute W/chest  05/31/2013   CLINICAL DATA:  Aches.  Fever.  EXAM: ACUTE ABDOMEN SERIES (ABDOMEN 2 VIEW & CHEST 1 VIEW)  COMPARISON:  None.  FINDINGS: Chest x-ray reveals no acute cardiopulmonary disease.  Abdominal soft tissues are unremarkable. Gas pattern is nonspecific. No free air is identified. No pathologic calcifications.  IMPRESSION: 1. No acute cardiopulmonary disease. 2. Abdominal series unremarkable .   Electronically Signed   By: Maisie Fus  Register   On: 05/31/2013 19:46   Assessment   The primary encounter diagnosis was Viral syndrome. Diagnoses of Myalgia and Arthralgia were also pertinent to this visit.  He is very symptomatic, but has little in the way of objective findings with the exception of a mildly elevated calcium which might be due to taking vitamin D. I have suggested that he rest and get extra fluids. He was given pain medication and Voltaren. I have suggested he leave off the vitamin D, and followup with her primary care physician early next week. No work until then.  Plan     1.  Meds:  The following meds were prescribed:   Discharge Medication List as of 05/31/2013  8:18 PM    START taking these medications   Details  diclofenac (VOLTAREN) 75 MG EC tablet Take 1 tablet (75 mg total) by mouth 2 (two) times daily., Starting 05/31/2013, Until Discontinued, Normal    HYDROcodone-acetaminophen (NORCO/VICODIN) 5-325 MG per tablet 1 to 2 tabs every 4 to 6 hours as needed for pain., Print         2.  Patient Education/Counseling:  The patient was given appropriate handouts, self care instructions, and instructed in symptomatic relief.    3.  Follow up:  The patient was told to follow up here if no better in 3 to 4 days, or sooner if becoming worse in any way, and given some  red flag symptoms such as increasing fever, persistent vomiting, or new neurological symptoms which would prompt immediate return.  [facility].        Reuben Likesavid C Deaire Mcwhirter, MD 05/31/13 2059

## 2013-05-31 NOTE — ED Notes (Signed)
Return from xray.  Larry Hayden doing orthostatic VS.

## 2013-05-31 NOTE — Discharge Instructions (Signed)

## 2013-05-31 NOTE — ED Notes (Signed)
Patient transported to X-ray by shuttle.

## 2013-06-04 ENCOUNTER — Encounter: Payer: Self-pay | Admitting: Internal Medicine

## 2013-06-04 ENCOUNTER — Ambulatory Visit: Payer: Medicaid Other | Attending: Internal Medicine | Admitting: Internal Medicine

## 2013-06-04 VITALS — BP 132/88 | HR 86 | Temp 98.6°F | Resp 17

## 2013-06-04 DIAGNOSIS — F17200 Nicotine dependence, unspecified, uncomplicated: Secondary | ICD-10-CM

## 2013-06-04 DIAGNOSIS — Z79899 Other long term (current) drug therapy: Secondary | ICD-10-CM | POA: Insufficient documentation

## 2013-06-04 DIAGNOSIS — Z791 Long term (current) use of non-steroidal anti-inflammatories (NSAID): Secondary | ICD-10-CM | POA: Insufficient documentation

## 2013-06-04 DIAGNOSIS — M25649 Stiffness of unspecified hand, not elsewhere classified: Secondary | ICD-10-CM | POA: Insufficient documentation

## 2013-06-04 DIAGNOSIS — M2550 Pain in unspecified joint: Secondary | ICD-10-CM | POA: Insufficient documentation

## 2013-06-04 DIAGNOSIS — Z09 Encounter for follow-up examination after completed treatment for conditions other than malignant neoplasm: Secondary | ICD-10-CM

## 2013-06-04 DIAGNOSIS — L0231 Cutaneous abscess of buttock: Secondary | ICD-10-CM | POA: Insufficient documentation

## 2013-06-04 DIAGNOSIS — L03317 Cellulitis of buttock: Secondary | ICD-10-CM

## 2013-06-04 HISTORY — DX: Nicotine dependence, unspecified, uncomplicated: F17.200

## 2013-06-04 LAB — C-REACTIVE PROTEIN: CRP: <0.5 mg/dL (ref <0.60)

## 2013-06-04 LAB — RHEUMATOID FACTOR: Rhuematoid fact SerPl-aCnc: <10 IU/mL (ref <=14)

## 2013-06-04 MED ORDER — IBUPROFEN 800 MG PO TABS: 800.0000 mg | ORAL_TABLET | Freq: Three times a day (TID) | ORAL | Status: DC | PRN

## 2013-06-04 MED ORDER — CLINDAMYCIN HCL 300 MG PO CAPS: 300.0000 mg | ORAL_CAPSULE | Freq: Three times a day (TID) | ORAL | Status: DC

## 2013-06-04 NOTE — Progress Notes (Signed)
MRN: [id number] Name: Larry Hayden  Sex: male Age: 28 y.o. DOB: [birth year]  Allergies: Review of patient's allergies indicates no known allergies.  Chief Complaint  Patient presents with  . Follow-up    HPI: Patient is 28 y.o. male who recently went to the urgent care with symptoms of myalgia arthralgia fever, EMR reviewed patient had chest x-ray done x-ray done which was negative, blood work only noticeable for hypercalcemia, has been advised to stop taking vitamin D, patient denies any family history of arthritis but does complains of stiffness in his joints when he wake up in the morning currently denies any fever chills, patient also has history of abscess on left buttock which was drained in the past, patient is having recurrent problem noticed some discharge and is slightly painful.patient is still smoking cigarettes, advised patient to quit smoking.  History reviewed. No pertinent past medical history.  History reviewed. No pertinent past surgical history.    Medication List       This list is accurate as of: 06/04/13  4:09 PM.  Always use your most recent med list.               amoxicillin-clavulanate 875-125 MG per tablet  Commonly known as:  AUGMENTIN  Take 1 tablet by mouth 2 (two) times daily.     azithromycin 250 MG tablet  Commonly known as:  ZITHROMAX Z-PAK  Take as directed     benzonatate 100 MG capsule  Commonly known as:  TESSALON  Take 1 capsule (100 mg total) by mouth 3 (three) times daily as needed for cough.     cephALEXin 500 MG capsule  Commonly known as:  KEFLEX  Take 1 capsule (500 mg total) by mouth 4 (four) times daily.     clindamycin 300 MG capsule  Commonly known as:  CLEOCIN  Take 1 capsule (300 mg total) by mouth 3 (three) times daily.     clindamycin-benzoyl peroxide gel  Commonly known as:  BENZACLIN  Apply topically 2 (two) times daily.     cyclobenzaprine 10 MG tablet  Commonly known as:  FLEXERIL  Take 1 tablet (10  mg total) by mouth 3 (three) times daily as needed for muscle spasms.     diclofenac 75 MG EC tablet  Commonly known as:  VOLTAREN  Take 1 tablet (75 mg total) by mouth 2 (two) times daily.     fluticasone 50 MCG/ACT nasal spray  Commonly known as:  FLONASE  Place 2 sprays into both nostrils daily.     HYDROcodone-acetaminophen 5-325 MG per tablet  Commonly known as:  NORCO/VICODIN  1 to 2 tabs every 4 to 6 hours as needed for pain.     ibuprofen 800 MG tablet  Commonly known as:  ADVIL,MOTRIN  Take 1 tablet (800 mg total) by mouth every 8 (eight) hours as needed.     nicotine polacrilex 4 MG gum  Commonly known as:  NICORETTE  Take 1 each (4 mg total) by mouth as needed for smoking cessation.     varenicline 0.5 MG X 11 & 1 MG X 42 tablet  Commonly known as:  CHANTIX STARTING MONTH PAK  Take one 0.5 mg tablet by mouth once daily for 3 days, then increase to one 0.5 mg tablet twice daily for 4 days, then increase to one 1 mg tablet twice daily.     Vitamin D (Ergocalciferol) 50000 UNITS Caps capsule  Commonly known as:  DRISDOL  Take 1 capsule (  50,000 Units total) by mouth every 7 (seven) days.        Meds ordered this encounter  Medications  . clindamycin (CLEOCIN) 300 MG capsule    Sig: Take 1 capsule (300 mg total) by mouth 3 (three) times daily.    Dispense:  30 capsule    Refill:  0  . ibuprofen (ADVIL,MOTRIN) 800 MG tablet    Sig: Take 1 tablet (800 mg total) by mouth every 8 (eight) hours as needed.    Dispense:  30 tablet    Refill:  1    Immunization History  Administered Date(s) Administered  . Influenza,inj,Quad PF,36+ Mos 02/16/2013    Family History  Problem Relation Age of Onset  . Cancer Mother   . Cancer Maternal Uncle   . Heart disease Maternal Grandmother     History  Substance Use Topics  . Smoking status: Current Every Day Smoker -- 0.50 packs/day    Types: Cigarettes  . Smokeless tobacco: Not on file  . Alcohol Use: No    Review of  Systems   As noted in HPI  Filed Vitals:   06/04/13 1546  BP: 132/88  Pulse: 86  Temp: 98.6 F (37 C)  Resp: 17    Physical Exam  Physical Exam  Constitutional: No distress.  Eyes: EOM are normal. Pupils are equal, round, and reactive to light.  Cardiovascular: Normal rate and regular rhythm.   Pulmonary/Chest: Breath sounds normal. No respiratory distress. He has no wheezes. He has no rales.  Musculoskeletal:  Some tenderness at MCP joint R>L  Strength equal in all extremities   Skin:  Left buttock localized hard swelling redness minimally tender no current discharge     CBC    Component Value Date/Time   WBC 8.5 05/31/2013 1857   RBC 5.52 05/31/2013 1857   HGB 16.7 05/31/2013 1917   HCT 49.0 05/31/2013 1917   PLT 245 05/31/2013 1857   MCV 83.2 05/31/2013 1857   LYMPHSABS 2.8 05/31/2013 1857   MONOABS 0.6 05/31/2013 1857   EOSABS 0.2 05/31/2013 1857   BASOSABS 0.0 05/31/2013 1857    CMP     Component Value Date/Time   NA 141 05/31/2013 1917   K 3.7 05/31/2013 1917   CL 102 05/31/2013 1917   CO2 28 03/27/2013 1130   GLUCOSE 127* 05/31/2013 1917   BUN 15 05/31/2013 1917   CREATININE 1.00 05/31/2013 1917   CREATININE 0.87 03/27/2013 1130   CALCIUM 10.7* 03/27/2013 1130   PROT 6.9 03/27/2013 1130   ALBUMIN 4.5 03/27/2013 1130   AST 24 03/27/2013 1130   ALT 61* 03/27/2013 1130   ALKPHOS 67 03/27/2013 1130   BILITOT 0.9 03/27/2013 1130   GFRNONAA >89 03/27/2013 1130   GFRAA >89 03/27/2013 1130    Lab Results  Component Value Date/Time   CHOL 210* 03/27/2013 11:30 AM    No components found with this basename: hga1c    Lab Results  Component Value Date/Time   AST 24 03/27/2013 11:30 AM    Assessment and Plan  Follow up  Hypercalcemia - Plan:patient has stopped taking vitamin D, we'll checkadvised PTH, intact and calcium  Stiffness of hand joint - Plan: Rheumatoid factor, ANA, C-reactive protein  Joint pain - Plan: ibuprofen (ADVIL,MOTRIN) 800 MG tablet  Cellulitis  and abscess of buttock - Plan: clindamycin (CLEOCIN) 300 MG capsule, Ambulatory referral to General Surgery  Smoking Advised patient to quit smoking   Return in about 3 months (around 09/04/2013).  Bunyan Brier,  MD   

## 2013-06-04 NOTE — Progress Notes (Signed)
Patient here for follow up Was seen last week in the urgent care For generalized pain States his job requires him to be on his feet almost 8 hrs a day Recently has been a little weak And increased sweating

## 2013-06-05 ENCOUNTER — Telehealth: Payer: Self-pay | Admitting: *Deleted

## 2013-06-05 ENCOUNTER — Telehealth: Payer: Self-pay | Admitting: Emergency Medicine

## 2013-06-05 DIAGNOSIS — Z0000 Encounter for general adult medical examination without abnormal findings: Secondary | ICD-10-CM

## 2013-06-05 DIAGNOSIS — E215 Disorder of parathyroid gland, unspecified: Secondary | ICD-10-CM

## 2013-06-05 LAB — PTH, INTACT AND CALCIUM
Calcium: 11 mg/dL — ABNORMAL HIGH (ref 8.4–10.5)
PTH: 96.3 pg/mL — AB (ref 14.0–72.0)

## 2013-06-05 LAB — ANA: Anti Nuclear Antibody(ANA): NEGATIVE

## 2013-06-05 NOTE — Telephone Encounter (Signed)
Pt is aware of his lab results and the referral to the endocrinologist.

## 2013-06-05 NOTE — Telephone Encounter (Signed)
Pt given lab results with f/u referral to /endocrinology Referral placed

## 2013-06-05 NOTE — Telephone Encounter (Signed)
Message copied by Raynelle CharyWINFREE, Afreen Siebels R on Tue Jun 05, 2013  5:45 PM ------      Message from: Doris CheadleADVANI, DEEPAK      Created: Tue Jun 05, 2013 12:58 PM       Call and let the patient know that his parathyroid hormone and calcium is high, his other blood work is normal, patient needs to be seen by endocrinologist, place the referral and help patient to schedule appointment with endocrinologist. ------

## 2013-06-05 NOTE — Telephone Encounter (Signed)
Message copied by Darlis LoanSMITH, JILL D on Tue Jun 05, 2013  5:50 PM ------      Message from: Doris CheadleADVANI, DEEPAK      Created: Tue Jun 05, 2013 12:58 PM       Call and let the patient know that his parathyroid hormone and calcium is high, his other blood work is normal, patient needs to be seen by endocrinologist, place the referral and help patient to schedule appointment with endocrinologist. ------

## 2013-06-08 ENCOUNTER — Telehealth: Payer: Self-pay

## 2013-06-08 NOTE — Telephone Encounter (Signed)
Message copied by Lestine MountJUAREZ, Jerol Rufener L on Fri Jun 08, 2013  9:20 AM ------      Message from: Doris CheadleADVANI, DEEPAK      Created: Tue Jun 05, 2013 12:58 PM       Call and let the patient know that his parathyroid hormone and calcium is high, his other blood work is normal, patient needs to be seen by endocrinologist, place the referral and help patient to schedule appointment with endocrinologist. ------

## 2013-06-08 NOTE — Telephone Encounter (Signed)
Message copied by Lestine MountJUAREZ, Aikam Hellickson L on Fri Jun 08, 2013  9:17 AM ------      Message from: Doris CheadleADVANI, DEEPAK      Created: Tue Jun 05, 2013 12:58 PM       Call and let the patient know that his parathyroid hormone and calcium is high, his other blood work is normal, patient needs to be seen by endocrinologist, place the referral and help patient to schedule appointment with endocrinologist. ------

## 2013-06-08 NOTE — Telephone Encounter (Signed)
Interpreter line used Patient is aware of her lab results 

## 2013-06-13 ENCOUNTER — Encounter: Payer: Self-pay | Admitting: Endocrinology

## 2013-06-13 ENCOUNTER — Ambulatory Visit (INDEPENDENT_AMBULATORY_CARE_PROVIDER_SITE_OTHER): Payer: Medicaid Other | Admitting: Endocrinology

## 2013-06-13 VITALS — BP 132/84 | HR 96 | Temp 98.2°F | Resp 16 | Ht 68.25 in | Wt 257.8 lb

## 2013-06-13 DIAGNOSIS — E213 Hyperparathyroidism, unspecified: Secondary | ICD-10-CM

## 2013-06-13 NOTE — Progress Notes (Signed)
Patient ID: Larry Hayden, male   DOB: 1985/11/20, 28 y.o.   MRN: [id number]    Chief complaint: High calcium  History of Present Illness:   He has had a high calcium diagnosed in 1/15 on routine lab work. He is a recent immigrant from [place] and has not had any previous regular labs done. His blood pressure initially was relatively high. Also had screening for vitamin D level done which was low at 21 and he was prescribed 50,000 units weekly He stopped this after 3 months He does not consume last quantities of dairy products or calcium-containing antacids   Lab Results  Component Value Date   CALCIUM 11.0* 06/04/2013   CALCIUM 10.7* 03/27/2013    The hypercalcemia is not associated with any pathologic fractures, renal insufficiency, history of kidney stones, known lung disease or known carcinoma   Prior serologic  studies have included:  Lab Results  Component Value Date   PTH 96.3* 06/04/2013   CALCIUM 11.0* 06/04/2013   CAION 1.40* 05/31/2013       Medication List       This list is accurate as of: 06/13/13  9:41 PM.  Always use your most recent med list.               amoxicillin-clavulanate 875-125 MG per tablet  Commonly known as:  AUGMENTIN  Take 1 tablet by mouth 2 (two) times daily.     cephALEXin 500 MG capsule  Commonly known as:  KEFLEX  Take 1 capsule (500 mg total) by mouth 4 (four) times daily.     clindamycin-benzoyl peroxide gel  Commonly known as:  BENZACLIN  Apply topically 2 (two) times daily.     cyclobenzaprine 10 MG tablet  Commonly known as:  FLEXERIL  Take 1 tablet (10 mg total) by mouth 3 (three) times daily as needed for muscle spasms.     diclofenac 75 MG EC tablet  Commonly known as:  VOLTAREN  Take 1 tablet (75 mg total) by mouth 2 (two) times daily.     fluticasone 50 MCG/ACT nasal spray  Commonly known as:  FLONASE  Place 2 sprays into both nostrils daily.     HYDROcodone-acetaminophen 5-325 MG per tablet  Commonly known as:   NORCO/VICODIN  1 to 2 tabs every 4 to 6 hours as needed for pain.     ibuprofen 800 MG tablet  Commonly known as:  ADVIL,MOTRIN  Take 1 tablet (800 mg total) by mouth every 8 (eight) hours as needed.     nicotine polacrilex 4 MG gum  Commonly known as:  NICORETTE  Take 1 each (4 mg total) by mouth as needed for smoking cessation.     varenicline 0.5 MG X 11 & 1 MG X 42 tablet  Commonly known as:  CHANTIX STARTING MONTH PAK  Take one 0.5 mg tablet by mouth once daily for 3 days, then increase to one 0.5 mg tablet twice daily for 4 days, then increase to one 1 mg tablet twice daily.        Allergies: No Known Allergies  No past medical history on file.  No past surgical history on file.  Family History  Problem Relation Age of Onset  . Cancer Mother   . Heart disease Maternal Grandmother   . Diabetes Neg Hx      Social History:  reports that he has been smoking Cigarettes.  He has been smoking about 0.50 packs per day. He does not have any smokeless  tobacco history on file. He reports that he does not drink alcohol or use illicit drugs.    REVIEW of SYSTEMS    He. has had no significant weight change recently   Wt Readings from Last 3 Encounters:  06/13/13 257 lb 12.8 oz (116.937 kg)  05/11/13 257 lb (116.574 kg)  02/16/13 259 lb 12.8 oz (117.845 kg)   Has not had any significant bone pain  History of significant acne present  No history of significant heartburn  His blood pressure was initially high but subsequently has been improved  No history of diabetes or abnormal glucose  No difficulties with libido or erectile function    EXAM:  BP 132/84  Pulse 96  Temp(Src) 98.2 F (36.8 C)  Resp 16  Ht 5' 8.25" (1.734 m)  Wt 257 lb 12.8 oz (116.937 kg)  BMI 38.89 kg/m2  SpO2 97%  GENERAL:  has generalized obesity   No pallor, clubbing, lymphadenopathy or edema.  Skin: Has facial acne   EYES:  Externally normal.    ENT: Oral mucosa and tongue  normal.  THYROID:  Right lobe of thyroid is just palpable medially, smooth. Left lobe not palpable   HEART:  Normal  S1 and S2; no murmur or click.  CHEST:  Normal shape Lungs:   Vescicular breath sounds heard equally.  No crepitations/ wheeze.  ABDOMEN:  No distention.  Liver and spleen not palpable.  No other mass or tenderness.  NEUROLOGICAL: .Reflexes are normal bilaterally at  biceps.  SPINE AND JOINTS:  Normal.  Assessment/Plan:   HYPERCALCEMIA: Appears to be Secondary to hyperparathyroidism with elevated PTH level Currently appears to be asymptomatic and no history of nephrolithiasis, bone pain or known fracture Discussed with the patient in detail the nature of hyperparathyroidism, location of parathyroid glands, changes in body calcium physiology with hyperparathyroidism and possible long-term effects on bone and kidneys Also discussed options for treatment versus observation alone  Because of his young age he is a candidate for parathyroid surgery. He is somewhat reluctant to consider this at this time Will evaluate this further with parathyroid scan and 24-hour urine calcium Will repeat calcium and renal function in one month   Reather LittlerAjay Sosie Gato 06/13/2013, 9:41 PM

## 2013-06-13 NOTE — Patient Instructions (Signed)
Collect 24-hour urine as discussed

## 2013-06-15 NOTE — Addendum Note (Signed)
Addended by: Reather LittlerKUMAR, Shawnn Bouillon on: 06/15/2013 09:52 AM   Modules accepted: Orders

## 2013-06-20 ENCOUNTER — Other Ambulatory Visit: Payer: Medicaid Other

## 2013-06-20 DIAGNOSIS — E213 Hyperparathyroidism, unspecified: Secondary | ICD-10-CM

## 2013-06-22 ENCOUNTER — Other Ambulatory Visit: Payer: Medicaid Other

## 2013-06-28 ENCOUNTER — Ambulatory Visit: Payer: Medicaid Other | Admitting: Internal Medicine

## 2013-07-02 ENCOUNTER — Telehealth: Payer: Self-pay | Admitting: Endocrinology

## 2013-07-02 ENCOUNTER — Ambulatory Visit: Payer: Medicaid Other | Admitting: Internal Medicine

## 2013-07-02 NOTE — Telephone Encounter (Signed)
Noted, will discuss on his next visit, is also due to have 24-hour urine calcium

## 2013-07-02 NOTE — Telephone Encounter (Signed)
Pt declined to have NM PARATHYROID W/SPECT. Pt was scheduled for 07/05/13 @ 10am at [facility].

## 2013-07-04 ENCOUNTER — Other Ambulatory Visit: Payer: Self-pay | Admitting: *Deleted

## 2013-07-04 ENCOUNTER — Other Ambulatory Visit: Payer: Medicaid Other

## 2013-07-04 DIAGNOSIS — E213 Hyperparathyroidism, unspecified: Secondary | ICD-10-CM

## 2013-07-05 ENCOUNTER — Encounter (HOSPITAL_COMMUNITY): Payer: Medicaid Other

## 2013-07-06 LAB — CALCIUM, URINE, 24 HOUR
Calcium, 24H Urine: 147.6 mg/24 hr (ref 100.0–300.0)
Calcium, Ur: 16.4 mg/dL

## 2013-07-06 LAB — CREATININE, URINE, 24 HOUR
CREATININE UR: 190.4 mg/dL (ref 24.0–392.0)
Creatinine, 24H Ur: 1713.6 mg/24 hr (ref 1000.0–2000.0)

## 2013-07-09 ENCOUNTER — Other Ambulatory Visit (INDEPENDENT_AMBULATORY_CARE_PROVIDER_SITE_OTHER): Payer: Medicaid Other

## 2013-07-09 DIAGNOSIS — E213 Hyperparathyroidism, unspecified: Secondary | ICD-10-CM

## 2013-07-09 LAB — BASIC METABOLIC PANEL
BUN: 14 mg/dL (ref 6–23)
CHLORIDE: 104 meq/L (ref 96–112)
CO2: 26 mEq/L (ref 19–32)
Calcium: 11 mg/dL — ABNORMAL HIGH (ref 8.4–10.5)
Creatinine, Ser: 0.9 mg/dL (ref 0.4–1.5)
GFR: 104.66 mL/min (ref >60.00)
Glucose, Bld: 128 mg/dL — ABNORMAL HIGH (ref 70–99)
POTASSIUM: 3.7 meq/L (ref 3.5–5.1)
SODIUM: 137 meq/L (ref 135–145)

## 2013-07-13 ENCOUNTER — Encounter: Payer: Self-pay | Admitting: Endocrinology

## 2013-07-13 ENCOUNTER — Ambulatory Visit (INDEPENDENT_AMBULATORY_CARE_PROVIDER_SITE_OTHER): Payer: Medicaid Other | Admitting: Endocrinology

## 2013-07-13 VITALS — BP 138/90 | HR 91 | Temp 98.2°F | Resp 14 | Ht 68.5 in | Wt 259.6 lb

## 2013-07-13 DIAGNOSIS — E210 Primary hyperparathyroidism: Secondary | ICD-10-CM

## 2013-07-13 NOTE — Progress Notes (Signed)
Patient ID: Larry Hayden, male   DOB: [birth year], 28 y.o.   MRN: [id number]   Chief complaint: High calcium, followup  History of Present Illness:   He  had a high calcium diagnosed in 1/15 on routine lab work. He is a recent immigrant from [place] and has not had any previous regular labs  Also had screening for vitamin D level done which was low at 21 and he was prescribed 50,000 units weekly. He stopped this after 3 months The hypercalcemia is not associated with any pathologic fractures, renal insufficiency, history of kidney stones, known lung disease or known carcinoma He does not consume last quantities of dairy products or calcium-containing antacids  Because of his age he has been recommended parathyroid surgery He has been scheduled for parathyroid scan but he believes he was not scheduled for it; most likely he did not get message because of language difficulty   Also recent urine calcium appears to be normal; repeat calcium is still the same this month   Lab Results  Component Value Date   CALCIUM 11.0* 07/09/2013   CALCIUM 11.0* 06/04/2013   CALCIUM 10.7* 03/27/2013    Prior serologic  studies have included:  Lab Results  Component Value Date   PTH 96.3* 06/04/2013   CALCIUM 11.0* 07/09/2013   CAION 1.40* 05/31/2013   Appointment on 07/09/2013  Component Date Value Ref Range Status  . Sodium 07/09/2013 137  135 - 145 mEq/L Final  . Potassium 07/09/2013 3.7  3.5 - 5.1 mEq/L Final  . Chloride 07/09/2013 104  96 - 112 mEq/L Final  . CO2 07/09/2013 26  19 - 32 mEq/L Final  . Glucose, Bld 07/09/2013 128* 70 - 99 mg/dL Final  . BUN 40/98/119105/06/2013 14  6 - 23 mg/dL Final  . Creatinine, Ser 07/09/2013 0.9  0.4 - 1.5 mg/dL Final  . Calcium 47/82/956205/06/2013 11.0* 8.4 - 10.5 mg/dL Final  . GFR 13/08/657805/06/2013 104.66  >60.00 mL/min Final  Appointment on 07/04/2013  Component Date Value Ref Range Status  . Calcium, Ur 07/04/2013 16.4  Not Estab. mg/dL Final  . Calcium, 46N24H Urine 07/04/2013  147.6  100.0 - 300.0 mg/24 hr Final  . Creatinine, Ur 07/04/2013 190.4  24.0 - 392.0 mg/dL Final  . Creatinine, 62X24H Ur 07/04/2013 1713.6  1000.0 - 2000.0 mg/24 hr Final       Medication List       This list is accurate as of: 07/13/13  4:09 PM.  Always use your most recent med list.               amoxicillin-clavulanate 875-125 MG per tablet  Commonly known as:  AUGMENTIN  Take 1 tablet by mouth 2 (two) times daily.     cephALEXin 500 MG capsule  Commonly known as:  KEFLEX  Take 1 capsule (500 mg total) by mouth 4 (four) times daily.     clindamycin-benzoyl peroxide gel  Commonly known as:  BENZACLIN  Apply topically 2 (two) times daily.     cyclobenzaprine 10 MG tablet  Commonly known as:  FLEXERIL  Take 1 tablet (10 mg total) by mouth 3 (three) times daily as needed for muscle spasms.     diclofenac 75 MG EC tablet  Commonly known as:  VOLTAREN  Take 1 tablet (75 mg total) by mouth 2 (two) times daily.     fluticasone 50 MCG/ACT nasal spray  Commonly known as:  FLONASE  Place 2 sprays into both nostrils daily.  HYDROcodone-acetaminophen 5-325 MG per tablet  Commonly known as:  NORCO/VICODIN  1 to 2 tabs every 4 to 6 hours as needed for pain.     ibuprofen 800 MG tablet  Commonly known as:  ADVIL,MOTRIN  Take 1 tablet (800 mg total) by mouth every 8 (eight) hours as needed.     nicotine polacrilex 4 MG gum  Commonly known as:  NICORETTE  Take 1 each (4 mg total) by mouth as needed for smoking cessation.     varenicline 0.5 MG X 11 & 1 MG X 42 tablet  Commonly known as:  CHANTIX STARTING MONTH PAK  Take one 0.5 mg tablet by mouth once daily for 3 days, then increase to one 0.5 mg tablet twice daily for 4 days, then increase to one 1 mg tablet twice daily.        Allergies: No Known Allergies  No past medical history on file.  No past surgical history on file.  Family History  Problem Relation Age of Onset  . Cancer Mother   . Heart disease Maternal  Grandmother   . Diabetes Neg Hx      Social History:  reports that he has been smoking Cigarettes.  He has been smoking about 0.50 packs per day. He does not have any smokeless tobacco history on file. He reports that he does not drink alcohol or use illicit drugs.    REVIEW of SYSTEMS    He. has had no significant weight change recently   Wt Readings from Last 3 Encounters:  07/13/13 259 lb 9.6 oz (117.754 kg)  06/13/13 257 lb 12.8 oz (116.937 kg)  05/11/13 257 lb (116.574 kg)   Has not had any significant bone pain   EXAM:  BP 138/90  Pulse 91  Temp(Src) 98.2 F (36.8 C)  Resp 14  Ht 5' 8.5" (1.74 m)  Wt 259 lb 9.6 oz (117.754 kg)  BMI 38.89 kg/m2  SpO2 97%  Assessment/Plan:   HYPERCALCEMIA: Secondary to hyperparathyroidism with elevated PTH level Currently appears to be asymptomatic and no history of nephrolithiasis, bone pain or known fracture Also does not have hypercalciuria  Because of his young age he is a candidate for parathyroid surgery. Also not clear if he will be able to followup regularly in the future Again explained the nature of hyperparathyroidism and potential long-term effects Not clear if he has hypertension also, may have whitecoat syndrome.  He does agree to have the parathyroid scan and if he has a single adenoma would be reasonable to consider minimally invasive parathyroidectomy   Reather LittlerAjay Loree Shehata 07/13/2013, 4:09 PM

## 2013-08-03 ENCOUNTER — Encounter (HOSPITAL_COMMUNITY)
Admission: RE | Admit: 2013-08-03 | Discharge: 2013-08-03 | Disposition: A | Discharge status: Home / Self Care | Payer: Medicaid Other | Source: Ambulatory Visit | Attending: Endocrinology | Admitting: Endocrinology

## 2013-08-03 ENCOUNTER — Encounter (HOSPITAL_COMMUNITY): Payer: Self-pay

## 2013-08-03 DIAGNOSIS — E210 Primary hyperparathyroidism: Secondary | ICD-10-CM

## 2013-08-03 MED ORDER — TECHNETIUM TC 99M SESTAMIBI - CARDIOLITE
25.6000 | Freq: Once | INTRAVENOUS | Status: AC | PRN
  Administered 2013-08-03: 11:00:00 25.6 via INTRAVENOUS

## 2013-08-07 ENCOUNTER — Encounter: Payer: Self-pay | Admitting: *Deleted

## 2013-08-09 ENCOUNTER — Telehealth: Payer: Self-pay | Admitting: Endocrinology

## 2013-08-09 NOTE — Telephone Encounter (Signed)
Results given to sister

## 2013-08-09 NOTE — Telephone Encounter (Signed)
Please call the pt's sister with the results of the testing from Friday

## 2013-08-17 ENCOUNTER — Ambulatory Visit: Payer: Medicaid Other

## 2013-09-21 ENCOUNTER — Ambulatory Visit: Payer: Self-pay

## 2013-09-21 ENCOUNTER — Other Ambulatory Visit: Payer: Self-pay | Admitting: Occupational Medicine

## 2013-09-21 DIAGNOSIS — R52 Pain, unspecified: Secondary | ICD-10-CM

## 2013-10-19 ENCOUNTER — Encounter: Payer: Self-pay | Admitting: Family Medicine

## 2013-10-19 ENCOUNTER — Ambulatory Visit (INDEPENDENT_AMBULATORY_CARE_PROVIDER_SITE_OTHER): Payer: BC Managed Care – PPO

## 2013-10-19 ENCOUNTER — Ambulatory Visit (INDEPENDENT_AMBULATORY_CARE_PROVIDER_SITE_OTHER): Payer: BC Managed Care – PPO | Admitting: Family Medicine

## 2013-10-19 VITALS — BP 124/76 | HR 73 | Temp 98.6°F | Resp 16 | Ht 69.0 in | Wt 244.0 lb

## 2013-10-19 DIAGNOSIS — M545 Low back pain, unspecified: Secondary | ICD-10-CM

## 2013-10-19 DIAGNOSIS — E213 Hyperparathyroidism, unspecified: Secondary | ICD-10-CM

## 2013-10-19 DIAGNOSIS — R5381 Other malaise: Secondary | ICD-10-CM

## 2013-10-19 DIAGNOSIS — R5383 Other fatigue: Secondary | ICD-10-CM

## 2013-10-19 DIAGNOSIS — Z0000 Encounter for general adult medical examination without abnormal findings: Secondary | ICD-10-CM

## 2013-10-19 DIAGNOSIS — Z13220 Encounter for screening for lipoid disorders: Secondary | ICD-10-CM

## 2013-10-19 DIAGNOSIS — L739 Follicular disorder, unspecified: Secondary | ICD-10-CM

## 2013-10-19 DIAGNOSIS — L738 Other specified follicular disorders: Secondary | ICD-10-CM

## 2013-10-19 DIAGNOSIS — L678 Other hair color and hair shaft abnormalities: Secondary | ICD-10-CM

## 2013-10-19 LAB — CBC
HCT: 47.6 % (ref 39.0–52.0)
Hemoglobin: 16.7 g/dL (ref 13.0–17.0)
MCH: 28.4 pg (ref 26.0–34.0)
MCHC: 35.1 g/dL (ref 30.0–36.0)
MCV: 81 fL (ref 78.0–100.0)
Platelets: 236 10*3/uL (ref 150–400)
RBC: 5.88 MIL/uL — ABNORMAL HIGH (ref 4.22–5.81)
RDW: 13.1 % (ref 11.5–15.5)
WBC: 6.7 10*3/uL (ref 4.0–10.5)

## 2013-10-19 LAB — COMPLETE METABOLIC PANEL WITHOUT GFR
ALT: 41 U/L (ref 0–53)
Alkaline Phosphatase: 61 U/L (ref 39–117)
GFR, Est African American: >89 mL/min
Glucose, Bld: 117 mg/dL — ABNORMAL HIGH (ref 70–99)
Potassium: 4.1 meq/L (ref 3.5–5.3)
Sodium: 135 meq/L (ref 135–145)
Total Protein: 7.6 g/dL (ref 6.0–8.3)

## 2013-10-19 LAB — COMPLETE METABOLIC PANEL WITH GFR
AST: 21 U/L (ref 0–37)
Albumin: 4.6 g/dL (ref 3.5–5.2)
BUN: 17 mg/dL (ref 6–23)
CO2: 26 mEq/L (ref 19–32)
Calcium: 11 mg/dL — ABNORMAL HIGH (ref 8.4–10.5)
Chloride: 102 mEq/L (ref 96–112)
Creat: 0.89 mg/dL (ref 0.50–1.35)
GFR, Est Non African American: >89 mL/min
Total Bilirubin: 0.6 mg/dL (ref 0.2–1.2)

## 2013-10-19 LAB — POCT URINALYSIS DIPSTICK
Bilirubin, UA: NEGATIVE
Blood, UA: NEGATIVE
Glucose, UA: NEGATIVE
Ketones, UA: NEGATIVE
Leukocytes, UA: NEGATIVE
Nitrite, UA: NEGATIVE
Spec Grav, UA: 1.025
Urobilinogen, UA: 0.2
pH, UA: 6

## 2013-10-19 LAB — POCT UA - MICROSCOPIC ONLY
Bacteria, U Microscopic: NEGATIVE
Casts, Ur, LPF, POC: NEGATIVE
Crystals, Ur, HPF, POC: NEGATIVE
Mucus, UA: NEGATIVE
RBC, urine, microscopic: NEGATIVE
WBC, Ur, HPF, POC: NEGATIVE
Yeast, UA: NEGATIVE

## 2013-10-19 LAB — LDL CHOLESTEROL, DIRECT: Direct LDL: 162 mg/dL — ABNORMAL HIGH

## 2013-10-19 LAB — TSH: TSH: 0.743 u[IU]/mL (ref 0.350–4.500)

## 2013-10-19 MED ORDER — MUPIROCIN 2 % EX OINT: TOPICAL_OINTMENT | CUTANEOUS | Status: DC

## 2013-10-19 NOTE — Patient Instructions (Signed)
SUMMARY AND RECOMMENDATIONS ?The most common clinical presentation of primary hyperparathyroidism (PHPT) in western populations is asymptomatic hypercalcemia detected by routine biochemical screening. The serum parathyroid hormone (PTH) concentration is either frankly elevated or within the normal range but inappropriately elevated given the patient's hypercalcemia. (See 'Asymptomatic primary hyperparathyroidism' above.) ?In 2009, an international panel of experts recognized normocalcemic PHPT as a phenotype of PHPT in which PTH levels are elevated but serum total and ionized calcium levels are normal, and all secondary causes for hyperparathyroidism (table 1) are excluded. Patients with normocalcemic hyperparathyroidism typically come to medical attention in the setting of an evaluation for low bone mineral density. (See 'Normocalcemic primary hyperparathyroidism' above.) ?Parathyroid crisis, which is rare, is characterized by severe hypercalcemia, with the serum calcium concentration usually above 15 mg/dL (3.8 mmol/L) and marked symptoms of hypercalcemia: in particular, central nervous system dysfunction. (See 'Parathyroid crisis' above.) ?The classical symptoms and signs of PHPT, such as osteitis fibrosa cystica and nephrolithiasis, are due to prolonged excessive PTH secretion and hypercalcemia. Although osteitis fibrosa cystica is rarely seen in the [place] and [place], it is still prevalent in developing countries. The geographical differences in the clinical manifestations of PHPT may be explained, at least in part, by the greater prevalence of vitamin D deficiency in some countries. (See 'Classical' above.) ?Although many patients with PHPT have neuropsychological complaints, the extent and attribution of these symptoms to PHPT in a given patient requires further elucidation. (See 'Neuropsychiatric disturbances' above.) ?Patients with PHPT may have decreased bone mineral density (BMD), in  particular at more cortical sites (forearm and hip) as compared with more cancellous sites (spine). (See 'Skeletal' above.) ?Important subclinical renal manifestations of PHPT include asymptomatic nephrolithiasis, hypercalciuria, nephrocalcinosis, chronic renal insufficiency, and several abnormalities in renal tubular function: in particular, decreased concentrating ability (table 4). (See 'Subclinical renal disease' above.) ?The nature of cardiovascular involvement and the impact of PHPT on cardiovascular mortality require further elucidation. (See 'Cardiovascular' above.)

## 2013-10-19 NOTE — Progress Notes (Signed)
Chief Complaint:  Chief Complaint  Patient presents with  . Annual Exam  . Back Pain    went over ROS verbally  . Shoulder Pain  . Hair/Scalp Problem    facial hair falling out    HPI: Larry Hayden is a 28 y.o. male who is here for an visit but he has other concerns as well. He does not remember his last visit for an annual physical.  He has chronic  back pain, started 2 years ago, he has NKI. No numbness or tingling. Feels some weakness in his low and mid back Has not tried anythign for his . Denies any numbness/weakness or tingling  Has a history of primary hyperparathyroid, PTH was abnormal, Ca was high was being followed by Dr Reather Littler. The dx was made recently due to elevated Ca from 03/2013 He has had a scan, he does not want to go back to see Dr Lucianne Muss, he would like to see another endocrinologist.  Please see last OV and also recent scans and labs in epic EMR Based on the last OV in May and JUne 2015 , scan and correspondence since the parathyroid scan was negative Dr Lucianne Muss wanted to see him in 4 months to get blood work and keep an eye on his Ca levels  FINDINGS:  On the 15 minute image, normal thyroid activity is noted, which  demonstrates normal washout with no significant residual activity in  the thyroid/parathyroid bed on 2 hour imaging. Normal salivary gland  uptake and cardiac uptake is noted.  IMPRESSION:  Normal parathyroid scan.  Electronically Signed  By: Elige Ko  On: 08/03/2013 14:33  He feels his face hair is falling out. He has a rash also on his buttock, he has a hx of a buttock abscess, sometimes it still hurts. He was given clindamycin t gel for his face and has been putting the medicine on that. It seesm to help when it recurs. He denies DM, he has high cholesterol, He already ate today.   Past Medical History  Diagnosis Date  . Hyperparathyroidism     was followed by Dr Reather Littler at [facility]   History reviewed. No  pertinent past surgical history. History   Social History  . Marital Status: Single    Spouse Name: N/A    Number of Children: N/A  . Years of Education: N/A   Social History Main Topics  . Smoking status: Current Every Day Smoker -- 0.50 packs/day    Types: Cigarettes  . Smokeless tobacco: None  . Alcohol Use: No  . Drug Use: No  . Sexual Activity: None   Other Topics Concern  . None   Social History Narrative   Single; no children   Family History  Problem Relation Age of Onset  . Cancer Mother   . Heart disease Maternal Grandmother   . Diabetes Neg Hx    No Known Allergies Prior to Admission medications   Not on File     ROS: The patient denies fevers, chills, night sweats, unintentional weight loss, chest pain, palpitations, wheezing, dyspnea on exertion, nausea, vomiting, abdominal pain, dysuria, hematuria, melena, numbness,, or tingling.   All other systems have been reviewed and were otherwise negative with the exception of those mentioned in the HPI and as above.    PHYSICAL EXAM: Filed Vitals:   10/19/13 1002  BP: 124/76  Pulse: 73  Temp: 98.6 F (37 C)  Resp: 16   Filed Vitals:  10/19/13 1002  Height: 5\' 9"  (1.753 m)  Weight: 244 lb (110.678 kg)   Body mass index is 36.02 kg/(m^2).  General: Alert, no acute distress HEENT:  Normocephalic, atraumatic, oropharynx patent. EOMI, PERRLA Cardiovascular:  Regular rate and rhythm, no rubs murmurs or gallops.  No Carotid bruits, radial pulse intact. No pedal edema.  Respiratory: Clear to auscultation bilaterally.  No wheezes, rales, or rhonchi.  No cyanosis, no use of accessory musculature GI: No organomegaly, abdomen is soft and non-tender, positive bowel sounds.  No masses. Skin: No rashes. Neurologic: Facial musculature symmetric. Psychiatric: Patient is appropriate throughout our interaction. Lymphatic: No cervical lymphadenopathy Musculoskeletal: Gait intact. GU-testicular and scrotal exam  normal, circumcised + paramsk tenderness  Full ROM 5/5 strength, 2/2 DTRs No saddle anesthesia Straight leg negative Hip and knee exam--normal   LABS: Results for orders placed in visit on 10/19/13  COMPLETE METABOLIC PANEL WITH GFR      Result Value Ref Range   Sodium 135  135 - 145 mEq/L   Potassium 4.1  3.5 - 5.3 mEq/L   Chloride 102  96 - 112 mEq/L   CO2 26  19 - 32 mEq/L   Glucose, Bld 117 (*) 70 - 99 mg/dL   BUN 17  6 - 23 mg/dL   Creat 6.960.89  2.950.50 - 2.841.35 mg/dL   Total Bilirubin 0.6  0.2 - 1.2 mg/dL   Alkaline Phosphatase 61  39 - 117 U/L   AST 21  0 - 37 U/L   ALT 41  0 - 53 U/L   Total Protein 7.6  6.0 - 8.3 g/dL   Albumin 4.6  3.5 - 5.2 g/dL   Calcium 13.211.0 (*) 8.4 - 10.5 mg/dL   GFR, Est African American >89     GFR, Est Non African American >89    CBC      Result Value Ref Range   WBC 6.7  4.0 - 10.5 K/uL   RBC 5.88 (*) 4.22 - 5.81 MIL/uL   Hemoglobin 16.7  13.0 - 17.0 g/dL   HCT 44.047.6  10.239.0 - 72.552.0 %   MCV 81.0  78.0 - 100.0 fL   MCH 28.4  26.0 - 34.0 pg   MCHC 35.1  30.0 - 36.0 g/dL   RDW 36.613.1  44.011.5 - 34.715.5 %   Platelets 236  150 - 400 K/uL  TSH      Result Value Ref Range   TSH 0.743  0.350 - 4.500 uIU/mL  LDL CHOLESTEROL, DIRECT      Result Value Ref Range   Direct LDL 162 (*)   POCT UA - MICROSCOPIC ONLY      Result Value Ref Range   WBC, Ur, HPF, POC neg     RBC, urine, microscopic neg     Bacteria, U Microscopic negg     Mucus, UA neg     Epithelial cells, urine per micros 0-1     Crystals, Ur, HPF, POC neg     Casts, Ur, LPF, POC neg     Yeast, UA neg    POCT URINALYSIS DIPSTICK      Result Value Ref Range   Color, UA yellow     Clarity, UA clear     Glucose, UA neg     Bilirubin, UA neg     Ketones, UA neg     Spec Grav, UA 1.025     Blood, UA neg     pH, UA 6.0  Protein, UA trace     Urobilinogen, UA 0.2     Nitrite, UA neg     Leukocytes, UA Negative       EKG/XRAY:   Primary read interpreted by Dr. Conley Rolls at [facility]. Neg for  sclerotic lesions  Neg for fracture or dislocations       ASSESSMENT/PLAN: Encounter Diagnoses  Name Primary?  . Annual physical exam Yes  . Other malaise and fatigue   . Low back pain without sciatica, unspecified back pain laterality   . Hyperparathyroidism   . Screening for hyperlipidemia   . Folliculitis    28 y/o [name] with a PMH of recently dx primary hyperparathyroid disease, hyperlipidemia is here for an annual exam. He also has msk strain and sprain, back xrays are negative. Additionally he has folliculitis where he has a beard and has to shave, he also has a hisotry of left buttock abscess which has been ID in the past but occasionally flares up. He is here with an interpretor from [facility] 1. Annual labs 2. Refer to a different endocrinologist for follow-up of hyperparathyroid, he was given more info on hyperparathyroid, he will look it up on google. I would prefer he be in the [facility] system since he  Has had all his labs and scans done through them and also [facility], so will ask endo to see if they can transfer his care over to Dr Josem Kaufmann. IF not possible then will refer to Dr Talmage Nap 3. Rx bactroban ointment fro folliculitis and also for absces when he has a recrrence, sizt bath and warm compresses advised.   Gross sideeffects, risk and benefits, and alternatives of medications d/w patient. Patient is aware that all medications have potential sideeffects and we are unable to predict every sideeffect or drug-drug interaction that may occur.  Gaddiel Cullens PHUONG, DO 10/20/2013 10:32 AM

## 2013-10-20 ENCOUNTER — Encounter: Payer: Self-pay | Admitting: Family Medicine

## 2013-10-20 ENCOUNTER — Telehealth: Payer: Self-pay | Admitting: Family Medicine

## 2013-10-21 NOTE — Telephone Encounter (Signed)
error 

## 2013-11-28 ENCOUNTER — Ambulatory Visit: Payer: Self-pay | Admitting: Internal Medicine

## 2013-12-17 ENCOUNTER — Encounter: Payer: Self-pay | Admitting: Internal Medicine

## 2013-12-17 ENCOUNTER — Ambulatory Visit (INDEPENDENT_AMBULATORY_CARE_PROVIDER_SITE_OTHER): Payer: BC Managed Care – PPO | Admitting: Internal Medicine

## 2013-12-17 VITALS — BP 122/76 | HR 79 | Temp 98.5°F | Resp 12 | Ht 69.0 in | Wt 242.0 lb

## 2013-12-17 DIAGNOSIS — E213 Hyperparathyroidism, unspecified: Secondary | ICD-10-CM | POA: Insufficient documentation

## 2013-12-17 DIAGNOSIS — E559 Vitamin D deficiency, unspecified: Secondary | ICD-10-CM

## 2013-12-17 NOTE — Progress Notes (Signed)
Patient ID: Larry Hayden, male   DOB: Aug 01, 1985, 28 y.o.   MRN: [id number]   HPI  Larry Hayden is a 28 y.o.-year-old male, referred by his PCP, Dr. Orpah Cobb, for evaluation for hypercalcemia/hyperparathyroidism. Pt previously saw Dr. Lucianne Muss, he is here for a second opinion.  Pt was dx with hypercalcemia in 03/2013, after he established care with a PCP (he emigrated from [place], where he did not previously see a doctor).  I reviewed pt's pertinent labs: Lab Results  Component Value Date   PTH 96.3* 06/04/2013   CALCIUM 11.0* 10/19/2013   CALCIUM 11.0* 07/09/2013   CALCIUM 11.0* 06/04/2013   CALCIUM 10.7* 03/27/2013   Urinary calcium: Component     Latest Ref Rng 07/04/2013  Calcium, Ur     Not Estab. mg/dL 78.2  Calcium, 95A Urine     100.0 - 300.0 mg/24 hr 147.6  Creatinine, Ur     24.0 - 392.0 mg/dL 213.0  Creatinine, 86V Ur     1000.0 - 2000.0 mg/24 hr 1713.6   The calculated FE Ca is 0.007 (0.7%, so <1%).  He had a Tc sestamibi parathyroid scan (07/2013): Normal parathyroid scan.  No fractures or falls.   No h/o kidney stones.  No h/o CKD. Last BUN/Cr: Lab Results  Component Value Date   BUN 17 10/19/2013   CREATININE 0.89 10/19/2013   Pt is not on HCTZ.  He has h/o vitamin D deficiency. Last vit D level was: Component     Latest Ref Rng 03/27/2013  Vit D, 25-Hydroxy     30 - 89 ng/mL 21 (L)   Pt is not on calcium and vitamin D; he eats dairy and green, leafy, vegetables.  Pt does not have known FH of hypercalcemia, pituitary tumors, thyroid cancer, or osteoporosis.   I reviewed his chart and he also has a history of buttock abscess >> lanced, but keeps recurring.  ROS: Constitutional: no weight gain/loss, no fatigue, no subjective hyperthermia/hypothermia, + poor sleep Eyes: no blurry vision, no xerophthalmia ENT: + sore throat, no nodules palpated in throat, no dysphagia/odynophagia, no hoarseness Cardiovascular: no CP/SOB/palpitations/leg  swelling Respiratory: + cough/SOB Gastrointestinal: no N/V/D/+ C Musculoskeletal: no muscle/joint aches Skin: no rashes, + stretch marks Neurological: no tremors/numbness/tingling/dizziness Psychiatric: no depression/anxiety  Past Medical History  Diagnosis Date  . Hyperparathyroidism     was followed by Dr Reather Littler at [facility]   No past surgical history on file. History   Social History  . Marital Status: Single    Spouse Name: N/A    Number of Children: 0   Occupational History  . 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 Smoking status: Current Every Day Smoker -- 0.50 packs/day    Types: Cigarettes  . Smokeless tobacco: Not on file  . Alcohol Use: No  . Drug Use: No   Social History Narrative   Single; no children   Current Outpatient Prescriptions on File Prior to Visit  Medication Sig Dispense Refill  . mupirocin ointment (BACTROBAN) 2 % Apply to affected area BID as needed  30 g  2   No Known Allergies  Family History  Problem Relation Age of Onset  . Breast cancer Mother   . Heart disease Maternal Grandmother   . Diabetes Neg Hx    PE: BP 122/76  Pulse 79  Temp(Src) 98.5 F (36.9 C) (Oral)  Resp 12  Ht 5\' 9"  (1.753 m)  Wt 242 lb (109.77  kg)  BMI 35.72 kg/m2  SpO2 97% Wt Readings from Last 3 Encounters:  12/17/13 242 lb (109.77 kg)  10/19/13 244 lb (110.678 kg)  07/13/13 259 lb 9.6 oz (117.754 kg)   Constitutional: overweight, in NAD. No kyphosis. Eyes: PERRLA, EOMI, no exophthalmos ENT: moist mucous membranes, no thyromegaly, no cervical lymphadenopathy Cardiovascular: RRR, No MRG Respiratory: CTA B Gastrointestinal: abdomen soft, NT, ND, BS+ Musculoskeletal: no deformities, strength intact in all 4 Skin: moist, warm, no rashes Neurological: no tremor with outstretched hands, DTR normal in all 4  Assessment: 1. Hypercalcemia/hyperparathyroidism  Plan: Patient has had elevated calcium levels  since his first check, in 03/2013, with the highest level being at 11. A corresponding intact PTH level was also high, at 96.  - Pt also has vitamin D deficiency and was told not to start vit D supplements as these will elevate his calcium levels more. - No apparent complications from hypercalcemia: no h/o nephrolithiasis, no osteoporosis, no fractures. No abdominal pain, depression, bone pain. He does have constipation. No known FH of calcium disorders. - I discussed with the patient about the physiology of calcium and parathyroid hormone, and possible side effects from increased PTH, including kidney stones, osteoporosis, abdominal pain, etc.  - We discussed that we need to check whether his hyperparathyroidism is primary (Familial  hypocalciuric hypercalcemia - FHH or parathyroid adenoma) or secondary (to conditions like: vitamin D deficiency, calcium malabsorption, hypercalciuria, renal insufficiency, meds - HCTZ, Li, etc.) - I believe that he has FHH (I explained what this is and the fact that this is usually mild and no tx needed) based on the following pieces of info: - his urine calcium collection showed low-normal 24h urinary calcium (FECa <0.01) - he did not have normal calcium levels and he has mildly high PTH - his sestamibi scan was negative for a primary parathyroid tumor - however, since the urine collection was performed in the presence of vit D def., we first need to replete this and then recheck the collection - Today we will check a vitamin D level - I advised him to start 2000 IU vit D daily and come back for labs in 3 mo - at that time, if the vitamin D is normal, we will check:  calcium level intact PTH ([facility]) Magnesium Phosphorus vitamin D- 25 HO and 1,25 HO  Repeat a 24h urinary calcium/creatinine ratio - if vit D normal - pt given instructions for urine collection - If the tests indicate a parathyroid adenoma, I will refer him to surgery. We discussed possible  consequences of hyperparathyroidism: ~1/3 pts will develop complications over 15 years (OP, nephrolithiasis). I will wait for the results of the above labs and will discuss with the plan with the patient.  - we discussed that if he turns out to have Primary hyperparathyroidism, he does meet the age criteria for parathyroid surgery:  Increased calcium by more than 1 mg/dL above the upper limit of normal  Kidney ds.  Osteoporosis (or Vb fx) Age <29 years old New (2013): High UCa >400 mg/d and increased stone risk by biochemical stone risk analysis Presence of nephrolithiasis or nephrocalcinosis Pt's preference!  - I will see the patient back in 6 months, I will discuss through my chart or by phone  - time spent with the patient: 50 min, of which >50% was spent in reviewing his previous labs, evaluations, discussing the pathology of the PTH - calcium - vitamin D regulation, and developing a plan to further investigate  his condition.   Component     Latest Ref Rng 12/19/2013  Vit D, 25-Hydroxy     30 - 89 ng/mL 18 (L)   Vitamin D is indeed low. Start vit D 2000 IU as advised >> repeat labs in 3 mo.

## 2013-12-17 NOTE — Patient Instructions (Signed)
I believe you may have Familial hypocalciuric hypercalcemia.  We will need to get the vitamin D in the normal range to be able to tell for sure.  Start vitamin D 2000 units daily and come back for a vitamin D check in 3 months. If the vitamin D normalizes then, we will need to check a urine collection for calcium again.  Patient information (Up-to-Date): Collection of a 24-hour urine specimen  - You should collect every drop of urine during each 24-hour period. It does not matter how much or little urine is passed each time, as long as every drop is collected. - Begin the urine collection in the morning after you wake up, after you have emptied your bladder for the first time. - Urinate (empty the bladder) for the first time and flush it down the toilet. Note the exact time (eg, 6:15 AM). You will begin the urine collection at this time. - Collect every drop of urine during the day and night in an empty collection bottle. Store the bottle at room temperature or in the refrigerator. - If you need to have a bowel movement, any urine passed with the bowel movement should be collected. Try not to include feces with the urine collection. If feces does get mixed in, do not try to remove the feces from the urine collection bottle. - Finish by collecting the first urine passed the next morning, adding it to the collection bottle. This should be within ten minutes before or after the time of the first morning void on the first day (which was flushed). In this example, you would try to void between 6:05 and 6:25 on the second day. - If you need to urinate one hour before the final collection time, drink a full glass of water so that you can void again at the appropriate time. If you have to urinate 20 minutes before, try to hold the urine until the proper time. - Please note the exact time of the final collection, even if it is not the same time as when collection began on day 1. - The bottle(s) may be kept at  room temperature for a day or two, but should be kept cool or refrigerated for longer periods of time.  Please come back for a visit in 6 months.

## 2013-12-19 ENCOUNTER — Other Ambulatory Visit: Payer: Self-pay | Admitting: Internal Medicine

## 2013-12-19 LAB — VITAMIN D 25 HYDROXY (VIT D DEFICIENCY, FRACTURES): VIT D 25 HYDROXY: 18 ng/mL — AB (ref 30–89)

## 2013-12-24 ENCOUNTER — Telehealth: Payer: Self-pay | Admitting: Internal Medicine

## 2013-12-24 NOTE — Telephone Encounter (Signed)
Pts sister calling is buying the Vit D3 ok instead of just Vitamin D

## 2013-12-25 NOTE — Telephone Encounter (Signed)
Returned call and lvm advising ok to buy this vitamin for her brother.

## 2014-02-01 ENCOUNTER — Encounter: Payer: Self-pay | Admitting: Family Medicine

## 2014-02-01 ENCOUNTER — Ambulatory Visit (INDEPENDENT_AMBULATORY_CARE_PROVIDER_SITE_OTHER): Payer: BC Managed Care – PPO | Admitting: Family Medicine

## 2014-02-01 VITALS — BP 124/74 | HR 65 | Temp 98.2°F | Resp 18 | Ht 70.0 in | Wt 250.0 lb

## 2014-02-01 DIAGNOSIS — J0100 Acute maxillary sinusitis, unspecified: Secondary | ICD-10-CM

## 2014-02-01 DIAGNOSIS — R0982 Postnasal drip: Secondary | ICD-10-CM

## 2014-02-01 DIAGNOSIS — G5601 Carpal tunnel syndrome, right upper limb: Secondary | ICD-10-CM

## 2014-02-01 DIAGNOSIS — R739 Hyperglycemia, unspecified: Secondary | ICD-10-CM

## 2014-02-01 DIAGNOSIS — L649 Androgenic alopecia, unspecified: Secondary | ICD-10-CM

## 2014-02-01 LAB — POCT GLYCOSYLATED HEMOGLOBIN (HGB A1C): Hemoglobin A1C: 5.2

## 2014-02-01 MED ORDER — AMOXICILLIN-POT CLAVULANATE 875-125 MG PO TABS: 1.0000 | ORAL_TABLET | Freq: Two times a day (BID) | ORAL | Status: DC

## 2014-02-01 MED ORDER — FLUTICASONE PROPIONATE 50 MCG/ACT NA SUSP: 2.0000 | Freq: Every day | NASAL | Status: DC

## 2014-02-01 NOTE — Patient Instructions (Signed)
Carpal Tunnel Syndrome °Carpal tunnel syndrome is a disorder of the nervous system in the wrist that causes pain, hand weakness, and/or loss of feeling. Carpal tunnel syndrome is caused by the compression, stretching, or irritation of the median nerve at the wrist joint. Athletes who experience carpal tunnel syndrome may notice a decrease in their performance to the condition, especially for sports that require strong hand or wrist action.  °SYMPTOMS  °· Tingling, numbness, or burning pain in the hand or fingers. °· Inability to sleep due to pain in the hand. °· Sharp pains that shoot from the wrist up the arm or to the fingers, especially at night. °· Morning stiffness or cramping of the hand. °· Thumb weakness, resulting in difficulty holding objects or making a fist. °· Shiny, dry skin on the hand. °· Reduced performance in any sport requiring a strong grip. °CAUSES  °· Median nerve damage at the wrist is caused by pressure due to swelling, inflammation, or scarred tissue. °· Sources of pressure include: °¨ Repetitive gripping or squeezing that causes inflammation of the tendon sheaths. °¨ Scarring or shortening of the ligament that covers the median nerve. °¨ Traumatic injury to the wrist or forearm such as fracture, sprain, or dislocation. °¨ Prolonged hyperextension (wrist bent backward) or hyperflexion (wrist bent downward) of the wrist. °RISK INCREASES WITH: °· Diabetes mellitus. °· Menopause or amenorrhea. °· Rheumatoid arthritis. °· Raynaud disease. °· Pregnancy. °· Gout. °· Kidney disease. °· Ganglion cyst. °· Repetitive hand or wrist action. °· Hypothyroidism (underactive thyroid gland). °· Repetitive jolting or shaking of the hands or wrist. °· Prolonged forceful weight-bearing on the hands. °PREVENTION °· Bracing the hand and wrist straight during activities that involve repetitive grasping. °· For activities that require prolonged extension of the wrist (bending towards the top of the forearm)  periodically change the position of your wrists. °· Learn and use proper technique in activities that result in the wrist position in neutral to slight extension. °· Avoid bending the wrist into full extension or flexion (up or down). °· Keep the wrist in a straight (neutral) position. To keep the wrist in this position, wear a splint. °· Avoid repetitive hand and wrist motions. °· When possible avoid prolonged grasping of items (steering wheel of a car, a pen, a vacuum cleaner, or a rake). °· Loosen your grip for activities that require prolonged grasping of items. °· Place keyboards and writing surfaces at the correct height as to decrease strain on the wrist and hand. °· Alternate work tasks to avoid prolonged wrist flexion. °· Avoid pinching activities (needlework and writing) as they may irritate your carpal tunnel syndrome. °· If these activities are necessary, complete them for shorter periods of time. °· When writing, use a felt tip or rollerball pen and/or build up the grip on a pen to decrease the forces required for writing. °PROGNOSIS  °Carpal tunnel syndrome is usually curable with appropriate conservative treatment and sometimes resolves spontaneously. For some cases, surgery is necessary, especially if muscle wasting or nerve changes have developed.  °RELATED COMPLICATIONS  °· Permanent numbness and a weak thumb or fingers in the affected hand. °· Permanent paralysis of a portion of the hand and fingers. °TREATMENT  °Treatment initially consists of stopping activities that aggravate the symptoms as well as medication and ice to reduce inflammation. A wrist splint is often recommended for wear during activities of repetitive motion as well as at night. It is also important to learn and use proper technique when   performing activities that typically cause pain. On occasion, a corticosteroid injection may be given. If symptoms persist despite conservative treatment, surgery may be an option. Surgical  techniques free the pinched or compressed nerve. Carpal tunnel surgery is usually performed on an outpatient basis, meaning you go home the same day as surgery. These procedures provide almost complete relief of all symptoms in 95% of patients. Expect at least 2 weeks for healing after surgery. For cases that are the result of repeated jolting or shaking of the hand or wrist or prolonged hyperextension, surgery is not usually recommended because stretching of the median nerve, not compression, is usually the cause of carpal tunnel syndrome in these cases. MEDICATION   If pain medication is necessary, nonsteroidal anti-inflammatory medications, such as aspirin and ibuprofen, or other minor pain relievers, such as acetaminophen, are often recommended.  Do not take pain medication for 7 days before surgery.  Prescription pain relievers are usually only prescribed after surgery. Use only as directed and only as much as you need.  Corticosteroid injections may be given to reduce inflammation. However, they are not always recommended.  Vitamin B6 (pyridoxine) may reduce symptoms; use only if prescribed for your disorder. SEEK MEDICAL CARE IF:   Symptoms get worse or do not improve in 2 weeks despite treatment.  You also have a current or recent history of neck or shoulder injury that has resulted in pain or tingling elsewhere in your arm. Document Released: 02/22/2005 Document Revised: 07/09/2013 Document Reviewed: 06/06/2008 [facility] Patient Information 2015 [employer] [place]. This information is not intended to replace advice given to you by your health care provider. Make sure you discuss any questions you have with your health care provider. Minoxidil topical solution or foam What is this medicine? MINOXIDIL (mi NOX i dill) is used to increase new hair growth in cases of hereditary hair loss. When applied to the scalp, this medicine can promote hair growth in men with male pattern baldness. This  medicine can also help women with thin hair and frontal hair loss. Women should not use extra-strength minoxidil products. This medicine may be used for other purposes; ask your health care provider or pharmacist if you have questions. COMMON BRAND NAME(S): Rogaine What should I tell my health care provider before I take this medicine? They need to know if you have any of these conditions: -frontal hair loss or receding hairline -no family history of hair loss -sudden or patchy hair loss -unknown reason for hair loss -your scalp is red, inflamed, infected or painful -an unusual or allergic reaction to minoxidil, other medicines, foods, dyes, or preservatives -pregnant or trying to get pregnant -breast-feeding How should I use this medicine? This medicine is for external use on the scalp only. Do not take by mouth. Follow the directions that come with the product. The hair and scalp should be dry before you use this medicine. You do not need to shampoo your hair before each application. Do not use more often than directed. Talk to your pediatrician regarding the use of this medicine in children. This medicine is not approved for use in children. Overdosage: If you think you have taken too much of this medicine contact a poison control center or emergency room at once. NOTE: This medicine is only for you. Do not share this medicine with others. What if I miss a dose? If you miss a dose, use it as soon as you can. If it is almost time for your next dose, use only  that dose. Do not use double or extra doses. What may interact with this medicine? Interactions are not expected. Do not use any other medicines on the scalp without asking your doctor or health care professional. This list may not describe all possible interactions. Give your health care provider a list of all the medicines, herbs, non-prescription drugs, or dietary supplements you use. Also tell them if you smoke, drink alcohol, or use  illegal drugs. Some items may interact with your medicine. What should I watch for while using this medicine? This medicine must be used on a regular basis for your hair to regrow. It may take 2 to 4 months of regular use before you notice any improvement. It is important to continue to use this product to maintain regrowth of hair. Once you stop using it, the regrown hair will usually fall out within 3 months. If you do not see any new hair growth after 4 months, stop using this product and contact your doctor or health care professional. Do not get this medicine in your eyes, nose, mouth, or on other sensitive areas. If you do, rinse off with plenty of cool tap water. Always wash your hands after use. Do not apply if your scalp is cut, scraped, or sunburned. Some people may notice changes in hair color or texture after using this medicine. What side effects may I notice from receiving this medicine? Side effects that you should report to your doctor or health care professional as soon as possible: -chest pain or palpitations -dizziness or fainting -skin rash, blisters, or itching -sudden weight gain -swelling of the hands or feet Side effects that usually do not require medical attention (report to your doctor or health care professional if they continue or are bothersome): -headache -redness, irritation and itching at the site of application -unusual hair growth, on the face, arm, and back This list may not describe all possible side effects. Call your doctor for medical advice about side effects. You may report side effects to FDA at 1-800-FDA-1088. Where should I keep my medicine? Keep out of the reach of children. Store at room temperature between 20 and 25 degrees C (68 and 77 degrees F). Some products may be flammable. Keep away from heat, fire or flame. Throw away any unused medicine after the expiration date. NOTE: This sheet is a summary. It may not cover all possible information. If  you have questions about this medicine, talk to your doctor, pharmacist, or health care provider.  2015, Elsevier/Gold Standard. (2007-09-11 14:26:21)

## 2014-02-01 NOTE — Progress Notes (Addendum)
Chief Complaint:  Chief Complaint  Patient presents with  . Alopecia    hair loss states pillow covered in hair at times  . Shortness of Breath    3-4 months    HPI: Larry Hayden is a 28 y.o. male who is here for  1. carpel tunnel syndrome x 1 month, numbness all over, no hx of DM but has had high sugar in the past, he has n/t but no weakness Works as a Location managermachine operator and there is a lot of repeitive motion when he washes parts 2. PND and sinus sxs, + fevers and chills, no cough, no ear pain. He denies allergies or asthma, he has this intermittently for the last 3 weeks. NO CP, no palpitations no wheezing,no pedal edema 3. Both parents are bald, he wants to know why he is balding and losing hair.   He is being followed by Dr Lafe GarinGherge for his hyperparathyroid dz, he is taking his vitamin D  Past Medical History  Diagnosis Date  . Hyperparathyroidism     [facility]Ajay [facility]   No past surgical history on file. History   Social History  . Marital Status: Single    Spouse Name: N/A    Number of Children: N/A  . Years of Education: N/A   Social History Main Topics  . Smoking status: Current Every Day Smoker -- 0.50 packs/day    Types: Cigarettes  . Smokeless tobacco: None  . Alcohol Use: No  . Drug Use: No  . Sexual Activity: None   Other Topics Concern  . None   Social History Narrative   Single; no children   Family History  Problem Relation Age of Onset  . Cancer Mother   . Heart disease Maternal Grandmother   . Diabetes Neg Hx    No Known Allergies Prior to Admission medications   Medication Sig Start Date End Date Taking? Authorizing Provider  mupirocin ointment (BACTROBAN) 2 % Apply to affected area BID as needed 10/19/13   Thao P Le, DO     ROS: The patient denies fevers, chills, night sweats, unintentional weight loss, chest pain, palpitations, wheezing, dyspnea on exertion, nausea, vomiting, abdominal pain,  dysuria, hematuria, melena, + numbness, , or tingling.   All other systems have been reviewed and were otherwise negative with the exception of those mentioned in the HPI and as above.    PHYSICAL EXAM: Filed Vitals:   02/01/14 1033  BP: 124/74  Pulse: 65  Temp: 98.2 F (36.8 C)  Resp: 18   Filed Vitals:   02/01/14 1033  Height: 5\' 10"  (1.778 m)  Weight: 250 lb (113.399 kg)   Body mass index is 35.87 kg/(m^2).  General: Alert, no acute distress HEENT:  Normocephalic, atraumatic, oropharynx patent. EOMI, PERRLA TM nl, erythematous throat, No exudates. + boggy nares, + sinus tenderness. Cardiovascular:  Regular rate and rhythm, no rubs murmurs or gallops.  No Carotid bruits, radial pulse intact. No pedal edema.  Respiratory: Clear to auscultation bilaterally.  No wheezes, rales, or rhonchi.  No cyanosis, no use of accessory musculature GI: No organomegaly, abdomen is soft and non-tender, positive bowel sounds.  No masses. Skin: No rashes. Neurologic: Facial musculature symmetric. Psychiatric: Patient is appropriate throughout our interaction. Lymphatic: No cervical lymphadenopathy Musculoskeletal: Gait intact. Right + tinels, neg phalens Full rom ,5/5 strength, 2/2 DTRs Good radial pulses  LABS: Results for orders placed or performed in visit on 12/19/13  Vit D  25 hydroxy (rtn osteoporosis monitoring)  Result Value Ref Range   Vit D, 25-Hydroxy 18 (L) 30 - 89 ng/mL     EKG/XRAY:   [facility]   ASSESSMENT/PLAN: Encounter Diagnoses  Name Primary?  . Acute maxillary sinusitis, recurrence not specified Yes  . PND (post-nasal drip)   . Carpal tunnel syndrome of right wrist   . Male pattern baldness   . Hyperglycemia    Will call with HbA1c Rx Augmentin and also flonase Otc rogaine prn  Wrist splint and ROM exercises  F/u prn   Gross sideeffects, risk and benefits, and alternatives of medications d/w patient. Patient is aware that  all medications have potential sideeffects and we are unable to predict every sideeffect or drug-drug interaction that may occur.  LE, THAO PHUONG, DO 02/01/2014 11:09 AM

## 2014-03-28 ENCOUNTER — Telehealth: Payer: Self-pay | Admitting: *Deleted

## 2014-03-28 NOTE — Telephone Encounter (Signed)
Phoned and cancelled patient's OV for tomorrow and he is going to come Monday to the walk-in clinic.

## 2014-03-29 ENCOUNTER — Ambulatory Visit: Payer: Self-pay | Admitting: Family Medicine

## 2014-05-10 ENCOUNTER — Ambulatory Visit (INDEPENDENT_AMBULATORY_CARE_PROVIDER_SITE_OTHER): Payer: BLUE CROSS/BLUE SHIELD | Admitting: Family Medicine

## 2014-05-10 ENCOUNTER — Encounter: Payer: Self-pay | Admitting: Family Medicine

## 2014-05-10 VITALS — BP 135/88 | HR 87 | Temp 98.8°F | Resp 16 | Ht 68.75 in | Wt 258.6 lb

## 2014-05-10 DIAGNOSIS — R21 Rash and other nonspecific skin eruption: Secondary | ICD-10-CM

## 2014-05-10 DIAGNOSIS — L700 Acne vulgaris: Secondary | ICD-10-CM | POA: Diagnosis not present

## 2014-05-10 DIAGNOSIS — J309 Allergic rhinitis, unspecified: Secondary | ICD-10-CM

## 2014-05-10 MED ORDER — AZELASTINE HCL 0.1 % NA SOLN: 1.0000 | Freq: Two times a day (BID) | NASAL | Status: DC

## 2014-05-10 NOTE — Progress Notes (Signed)
 Chief Complaint:  Chief Complaint  Patient presents with  . Back Pain  . Rash    face and back    HPI: Larry Hayden is a 29 y.o. male who is here for  Follow-up,  He is doing well. Acne medicine did not work well, he was given topical Cleocin T gel but he states it did not work He got some generic acne wash over the counter and that seem to work better He states his buttock rash/ulcer has improved with the bactroban, he states it is still present but better He was given flonase for allergies but has ahd this before, so di dnot really make a difference. He is now seeing Dr Elvera nnox for his Hyperparathyroidism  Past Medical History  Diagnosis Date  . Hyperparathyroidism     was followed by Dr Reather Littler at [facility]   No past surgical history on file. History   Social History  . Marital Status: Single    Spouse Name: N/A  . Number of Children: N/A  . Years of Education: N/A   Social History Main Topics  . Smoking status: Current Every Day Smoker -- 0.50 packs/day    Types: Cigarettes  . Smokeless tobacco: Not on file  . Alcohol Use: No  . Drug Use: No  . Sexual Activity: Not on file   Other Topics Concern  . None   Social History Narrative   Single; no children   Family History  Problem Relation Age of Onset  . Cancer Mother   . Heart disease Maternal Grandmother   . Diabetes Neg Hx    No Known Allergies Prior to Admission medications   Medication Sig Start Date End Date Taking? Authorizing Provider  amoxicillin-clavulanate (AUGMENTIN) 875-125 MG per tablet Take 1 tablet by mouth 2 (two) times daily. Patient not taking: Reported on 05/10/2014 02/01/14    P , DO  fluticasone (FLONASE) 50 MCG/ACT nasal spray Place 2 sprays into both nostrils daily. Patient not taking: Reported on 05/10/2014 02/01/14    P , DO  mupirocin ointment (BACTROBAN) 2 % Apply to affected area BID as needed Patient not taking: Reported on 05/10/2014 10/19/13     P , DO     ROS: The patient denies fevers, chills, night sweats, unintentional weight loss, chest pain, palpitations, wheezing, dyspnea on exertion, nausea, vomiting, abdominal pain, dysuria, hematuria, melena, numbness, weakness, or tingling.   All other systems have been reviewed and were otherwise negative with the exception of those mentioned in the HPI and as above.    PHYSICAL EXAM: Filed Vitals:   05/10/14 1148  BP: 135/88  Pulse: 87  Temp: 98.8 F (37.1 C)  Resp: 16   Filed Vitals:   05/10/14 1148  Height: 5' 8.75" (1.746 m)  Weight: 258 lb 9.6 oz (117.3 kg)   Body mass index is 38.48 kg/(m^2).  General: Alert, no acute distress HEENT:  Normocephalic, atraumatic, oropharynx patent. EOMI, PERRLA Cardiovascular:  Regular rate and rhythm, no rubs murmurs or gallops.  No Carotid bruits, radial pulse intact. No pedal edema.  Respiratory: Clear to auscultation bilaterally.  No wheezes, rales, or rhonchi.  No cyanosis, no use of accessory musculature GI: No organomegaly, abdomen is soft and non-tender, positive bowel sounds.  No masses. Skin: + acne on face T zone and also on back, he has thick curly hair even when he does not shave he has acne underneath beard Neurologic: Facial musculature symmetric. Psychiatric: Patient is appropriate throughout  our interaction. Lymphatic: No cervical lymphadenopathy Musculoskeletal: Gait intact.   LABS: Results for orders placed or performed in visit on 02/01/14  POCT glycosylated hemoglobin (Hb A1C)  Result Value Ref Range   Hemoglobin A1C 5.2      EKG/XRAY:   Primary read [facility]   ASSESSMENT/PLAN: Encounter Diagnoses  Name Primary?  . Acne vulgaris Yes  . Rash and nonspecific skin eruption   . Allergic rhinitis, unspecified allergic rhinitis type    Patient was given over-the-counter acne medications. He was given names of benzyl peroxide and salicylic acid-containing products. He was  prescribed antihistamine nasal spray. He can use this in conjunction with his Flonase when necessary. He tried Rogaine for his hair loss, it worked for a little bit. We had a long discussion about potential side effects from long-term Rogaine and he decided that he did not want to do it. He has a family history of blad men in his family He has a follow-up appointment with Dr. Orvan JulyGereche and will do so as scheduled.  Gross sideeffects, risk and benefits, and alternatives of medications d/w patient. Patient is aware that all medications have potential sideeffects and we are unable to predict every sideeffect or drug-drug interaction that may occur.  Hamilton CapriLE,  PHUONG, DO 05/10/2014 12:41 PM

## 2014-05-14 ENCOUNTER — Encounter: Payer: Self-pay | Admitting: Family Medicine

## 2014-05-27 ENCOUNTER — Telehealth: Payer: Self-pay

## 2014-05-27 DIAGNOSIS — L708 Other acne: Secondary | ICD-10-CM

## 2014-05-27 NOTE — Telephone Encounter (Signed)
Okay to refer to dermatology 

## 2014-05-27 NOTE — Telephone Encounter (Signed)
The pt was told to call back if he skin issues were not resolved by using the cream we prescribed. He wants a referral to a dermatologist.

## 2014-06-18 ENCOUNTER — Encounter: Payer: Self-pay | Admitting: Internal Medicine

## 2014-06-18 ENCOUNTER — Ambulatory Visit (INDEPENDENT_AMBULATORY_CARE_PROVIDER_SITE_OTHER): Payer: BLUE CROSS/BLUE SHIELD | Admitting: Internal Medicine

## 2014-06-18 VITALS — BP 122/76 | HR 84 | Temp 98.1°F | Resp 12 | Wt 264.8 lb

## 2014-06-18 DIAGNOSIS — E559 Vitamin D deficiency, unspecified: Secondary | ICD-10-CM | POA: Diagnosis not present

## 2014-06-18 DIAGNOSIS — E213 Hyperparathyroidism, unspecified: Secondary | ICD-10-CM

## 2014-06-18 LAB — VITAMIN D 25 HYDROXY (VIT D DEFICIENCY, FRACTURES): VITD: 15.54 ng/mL — ABNORMAL LOW (ref 30.00–100.00)

## 2014-06-18 NOTE — Progress Notes (Signed)
Patient ID: Larry MilesFiras Delavega, male   DOB: [birth year], 29 y.o.   MRN: [id number]   HPI  Larry Hayden is a 29 y.o.-year-old male, initially referred by his PCP, Dr. Orpah CobbAdvani, for evaluation for hypercalcemia/hyperparathyroidism. Last visit 6 mo ago. He is here with the interpreter.  Reviewed and addended hx: Pt was dx with hypercalcemia in 03/2013, after he established care with a PCP (he emigrated from [place], where he did not previously see a doctor).   I reviewed pt's pertinent labs: Lab Results  Component Value Date   PTH 96.3* 06/04/2013   CALCIUM 11.0* 10/19/2013   CALCIUM 11.0* 07/09/2013   CALCIUM 11.0* 06/04/2013   CALCIUM 10.7* 03/27/2013   Urinary calcium: Component     Latest Ref Rng 07/04/2013  Calcium, Ur     Not Estab. mg/dL 40.916.4  Calcium, 81X24H Urine     100.0 - 300.0 mg/24 hr 147.6  Creatinine, Ur     24.0 - 392.0 mg/dL [id number]  Creatinine, 82N24H Ur     1000.0 - 2000.0 mg/24 hr 1713.6  The calculated FE Ca is 0.007 (0.7%, so <1%).  He had a Tc sestamibi parathyroid scan (07/2013): Normal parathyroid scan.  No fractures or falls. No h/o kidney stones.  No h/o CKD. Last BUN/Cr: Lab Results  Component Value Date   BUN 17 10/19/2013   CREATININE 0.89 10/19/2013   Pt is not on HCTZ.  He has h/o vitamin D deficiency. Last vit D level was low >> we started vit D 2000 IU, which she continues today. He did not return for labs in 3 months after starting the vitamin D.   Component     Latest Ref Rng 12/19/2013  Vit D, 25-Hydroxy     30 - 89 ng/mL 18 (L)   Prev: Component     Latest Ref Rng 03/27/2013  Vit D, 25-Hydroxy     30 - 89 ng/mL 21 (L)   Pt does not have known FH of hypercalcemia, pituitary tumors, thyroid cancer, or osteoporosis.   I reviewed his chart and he also has a history of buttock abscess >> lanced, but keeps recurring.  ROS: Constitutional: + weight gain, + fatigue, + subjective hyperthermia, + poor sleep, + nocturia Eyes: no blurry vision, no  xerophthalmia ENT: no sore throat, no nodules palpated in throat, no dysphagia/odynophagia, no hoarseness Cardiovascular: no CP/SOB/palpitations/leg swelling Respiratory: + cough/no SOB Gastrointestinal: no N/V/D/+ C Musculoskeletal: + muscle/no joint aches Skin: no rashes, + acne, + hair loss Neurological: no tremors/numbness/tingling/dizziness  I reviewed pt's medications, allergies, PMH, social hx, family hx, and changes were documented in the history of present illness. Otherwise, unchanged from my initial visit note.  Past Medical History  Diagnosis Date  . Hyperparathyroidism     Followed by Dr Elvera LennoxGherghe   No past surgical history on file. History   Social History  . Marital Status: Single    Spouse Name: N/A    Number of Children: 0   Occupational History  . 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 Smoking status: Current Every Day Smoker -- 0.50 packs/day    Types: Cigarettes  . Smokeless tobacco: Not on file  . Alcohol Use: No  . Drug Use: No   Social History Narrative   Single; no children   Current Outpatient Prescriptions on File Prior to Visit  Medication Sig Dispense Refill  . mupirocin ointment (BACTROBAN) 2 % Apply to affected area BID as needed  30 g  2   No Known Allergies  Family History  Problem Relation Age of Onset  . Breast cancer Mother   . Heart disease Maternal Grandmother   . Diabetes Neg Hx    PE: BP 122/76 mmHg  Pulse 84  Temp(Src) 98.1 F (36.7 C) (Oral)  Resp 12  Wt 264 lb 12.8 oz (120.112 kg)  SpO2 95% Body mass index is 39.4 kg/(m^2). Wt Readings from Last 3 Encounters:  06/18/14 264 lb 12.8 oz (120.112 kg)  05/10/14 258 lb 9.6 oz (117.3 kg)  02/01/14 250 lb (113.399 kg)   Constitutional: overweight, in NAD. No kyphosis. Eyes: PERRLA, EOMI, no exophthalmos ENT: moist mucous membranes, no thyromegaly, no cervical lymphadenopathy Cardiovascular: RRR, No MRG Respiratory: CTA  B Gastrointestinal: abdomen soft, NT, ND, BS+ Musculoskeletal: no deformities, strength intact in all 4 Skin: moist, warm, no rashes Neurological: no tremor with outstretched hands, DTR normal in all 4  Assessment: 1. Familial  hypocalciuric hypercalcemia - Hypercalcemia/hyperparathyroidism  Plan: Patient has had elevated calcium levels since his first check, in 03/2013, with the highest level being at 11. A corresponding intact PTH level was also high, at 96. None of the calcium levels checked were in the normal range. He also has vitamin D deficiency, and is on supplementation with 2000 units daily. He does not have any apparent complications from hypercalcemia: no h/o nephrolithiasis, no osteoporosis, no fractures. No abdominal pain, depression, bone pain. No known FH of calcium disorders, but his family is from [place], and he does not think that they were checked for it. His sister is in [place] but he does not know if she has any medical problems - At this visit, we discussed again that I believe that he has FHH (I again explained what this is and the fact that this is usually mild and no tx needed) based on the following pieces of info: - his urine calcium collection showed low-normal 24h urinary calcium (FECa <0.01) - he did not have normal calcium levels and he has mildly high PTH - his sestamibi scan was negative for a primary parathyroid tumor - No complications of high calcium - however, since the urine collection was performed in the presence of vit D def., we first need to replete this and then recheck the collection - At last visit, I advised him to start 2000 IU vit D daily and come back for labs in 3 mo. he did not come back for labs, so we'll need to check a level today. - I will also add: calcium level intact PTH ([facility]) - I will see the patient back in 1 year, but we may need to do labs before then - Patient agrees with the plan. He can receive the results of his labs through my  chart, as his sister speaks [place].  Office Visit on 06/18/2014  Component Date Value Ref Range Status  . VITD 06/18/2014 15.54* 30.00 - 100.00 ng/mL Final  . Calcium 06/18/2014 10.5* 8.7 - 10.2 mg/dL Final  . PTH 16/12/9602 44  15 - 65 pg/mL Final  . PTH 06/18/2014 Comment   Final   Comment: Interpretation                 Intact PTH    Calcium                                 (pg/mL)      (mg/dL)  Normal                          15 - 65     8.6 - 10.2 Primary Hyperparathyroidism         >65          >10.2 Secondary Hyperparathyroidism       >65          <10.2 Non-Parathyroid Hypercalcemia       <65          >10.2 Hypoparathyroidism                  <15          < 8.6 Non-Parathyroid Hypocalcemia    15 - 65          < 8.6    calcium is still higher than normal. PTH is lower than before. Vitamin D very low. Will increase the dose of his vitamin D to 4000 units daily. We'll need a repeat vitamin D level in 2 months.

## 2014-06-18 NOTE — Patient Instructions (Signed)
Please stop at the lab.  Please return in 1 year.  However, I will let you know about the results and will see when to come back for another vitamin D check.

## 2014-06-19 LAB — PTH, INTACT AND CALCIUM
Calcium: 10.5 mg/dL — ABNORMAL HIGH (ref 8.7–10.2)
PTH: 44 pg/mL (ref 15–65)

## 2014-07-26 ENCOUNTER — Ambulatory Visit (INDEPENDENT_AMBULATORY_CARE_PROVIDER_SITE_OTHER): Payer: BLUE CROSS/BLUE SHIELD | Admitting: Family Medicine

## 2014-07-26 ENCOUNTER — Encounter: Payer: Self-pay | Admitting: Family Medicine

## 2014-07-26 VITALS — BP 127/80 | HR 69 | Temp 98.8°F | Resp 16 | Ht 68.5 in | Wt 262.6 lb

## 2014-07-26 DIAGNOSIS — L0291 Cutaneous abscess, unspecified: Secondary | ICD-10-CM | POA: Diagnosis not present

## 2014-07-26 DIAGNOSIS — R635 Abnormal weight gain: Secondary | ICD-10-CM | POA: Diagnosis not present

## 2014-07-26 DIAGNOSIS — L708 Other acne: Secondary | ICD-10-CM | POA: Diagnosis not present

## 2014-07-26 NOTE — Progress Notes (Signed)
Chief Complaint:  Chief Complaint  Patient presents with  . Recurrent Skin Infections    on upper part of left buttocks  some days it swells     HPI: Larry Hayden is a 29 y.o. male who is here for  recurrent abscess/cyst on his left buttock. He had it about a week and half ago and wanted to come in but was not able to get an appointment. He has been using sitz baths. There was an opening and some discharge. It was hard. It was painful to sit on. He is now feeling much better. He does not have diabetes. We have screened him for this. He got an ointment over-the-counter. This helped a lot. He has been using the Hibiclens. He wants know if there's anything that he can do for it. He has a history of acne and has seen dermatology and he is on minocycline. Denies any fevers or chills.  Lab Results  Component Value Date   HGBA1C 5.2 02/01/2014   Lab Results  Component Value Date   LDLCALC 150* 03/27/2013   CREATININE 0.89 10/19/2013    Wt Readings from Last 3 Encounters:  07/26/14 262 lb 9.6 oz (119.115 kg)  06/18/14 264 lb 12.8 oz (120.112 kg)  05/10/14 258 lb 9.6 oz (117.3 kg)     Past Medical History  Diagnosis Date  . Hyperparathyroidism     Followed by Larry Hayden   No past surgical history on file. History   Social History  . Marital Status: Single    Spouse Name: N/A  . Number of Children: N/A  . Years of Education: N/A   Social History Main Topics  . Smoking status: Current Every Day Smoker -- 0.50 packs/day    Types: Cigarettes  . Smokeless tobacco: Not on file  . Alcohol Use: No  . Drug Use: No  . Sexual Activity: Not on file   Other Topics Concern  . None   Social History Narrative   Single; no children   Family History  Problem Relation Age of Onset  . Cancer Mother   . Heart disease Maternal Grandmother   . Diabetes Neg Hx    No Known Allergies Prior to Admission medications   Medication Sig Start Date End Date Taking? Authorizing  Provider  azelastine (ASTELIN) 0.1 % nasal spray Place 1 spray into both nostrils 2 (two) times daily. Use in each nostril as directed 05/10/14  Yes Larry Solow P Zainab Crumrine, DO  clindamycin (CLINDAGEL) 1 % gel  06/17/14  Yes Historical Provider, MD  minocycline (MINOCIN,DYNACIN) 100 MG capsule  06/17/14  Yes Historical Provider, MD     ROS: The patient denies fevers, chills, night sweats, unintentional weight loss, chest pain, palpitations, wheezing, dyspnea on exertion, nausea, vomiting, abdominal pain, dysuria, hematuria, melena, numbness, weakness, or tingling.   All other systems have been reviewed and were otherwise negative with the exception of those mentioned in the HPI and as above.    PHYSICAL EXAM: Filed Vitals:   07/26/14 1044  BP: 127/80  Pulse: 69  Temp: 98.8 F (37.1 C)  Resp: 16   Filed Vitals:   07/26/14 1044  Height: 5' 8.5" (1.74 m)  Weight: 262 lb 9.6 oz (119.115 kg)   Body mass index is 39.34 kg/(m^2).  General: Alert, no acute distress, obese [name] HEENT:  Normocephalic, atraumatic, oropharynx patent. EOMI, PERRLA Cardiovascular:  Regular rate and rhythm, no rubs murmurs or gallops.  No Carotid bruits, radial pulse intact.  No pedal edema.  Respiratory: Clear to auscultation bilaterally.  No wheezes, rales, or rhonchi.  No cyanosis, no use of accessory musculature GI: No organomegaly, abdomen is soft and non-tender, positive bowel sounds.  No masses. Skin: Positive healing left abscess/cyst opening. It is not indurated enough or tender enough to warrant any medications at this point. Positive acne vulgaris Neurologic: Facial musculature symmetric. Psychiatric: Patient is appropriate throughout our interaction. Lymphatic: No cervical lymphadenopathy Musculoskeletal: Gait intact.   LABS: Results for orders placed or performed in visit on 06/18/14  Vitamin D (25 hydroxy)  Result Value Ref Range   VITD 15.54 (L) 30.00 - 100.00 ng/mL  PTH, Intact and Calcium  Result  Value Ref Range   Calcium 10.5 (H) 8.7 - 10.2 mg/dL   PTH 44 15 - 65 pg/mL   PTH Comment      EKG/XRAY:   [facility]   ASSESSMENT/PLAN: Encounter Diagnoses  Name Primary?  . Other acne   . Weight gain   . Abscess Yes   This is a pleasant obese 29 year old Arabic speaking male who is here with an interpreter. He has a past medical history of hyperparathyroidism followed by Larry Hayden He has a recurrent left but abscess/cyst at open Larry Hayden is his spontaneously. Currently it has healed. There is no fluctuance underneath the skin. He denies any tenderness fevers or chills. Is not look infected anymore. Continue with soap, water, Hibiclens as needed. He can use neomycin over-the-counter as needed since it worked before [name], recommend weight loss. Would like to go see a nutritionist for weight loss strategies. He states states that he only eats one big meal a day and does not snack or drink sodas. He exercises 2-3 times a week. Follow-up as needed.   Gross sideeffects, risk and benefits, and alternatives of medications d/w patient. Patient is aware that all medications have potential sideeffects and we are unable to predict every sideeffect or drug-drug interaction that may occur.  Larry Rohrbach PHUONG, DO 07/31/2014 5:06 AM

## 2014-07-26 NOTE — Patient Instructions (Signed)
1500-1700  calorie diet  Calorie Counting for Weight Loss Calories are energy you get from the things you eat and drink. Your body uses this energy to keep you going throughout the day. The number of calories you eat affects your weight. When you eat more calories than your body needs, your body stores the extra calories as fat. When you eat fewer calories than your body needs, your body burns fat to get the energy it needs. Calorie counting means keeping track of how many calories you eat and drink each day. If you make sure to eat fewer calories than your body needs, you should lose weight. In order for calorie counting to work, you will need to eat the number of calories that are right for you in a day to lose a healthy amount of weight per week. A healthy amount of weight to lose per week is usually 1-2 lb (0.5-0.9 kg). A dietitian can determine how many calories you need in a day and give you suggestions on how to reach your calorie goal.  WHAT IS MY MY PLAN? My goal is to have __________ calories per day.  If I have this many calories per day, I should lose around __________ pounds per week. WHAT DO I NEED TO KNOW ABOUT CALORIE COUNTING? In order to meet your daily calorie goal, you will need to:  Find out how many calories are in each food you would like to eat. Try to do this before you eat.  Decide how much of the food you can eat.  Write down what you ate and how many calories it had. Doing this is called keeping a food log. WHERE DO I FIND CALORIE INFORMATION? The number of calories in a food can be found on a Nutrition Facts label. Note that all the information on a label is based on a specific serving of the food. If a food does not have a Nutrition Facts label, try to look up the calories online or ask your dietitian for help. HOW DO I DECIDE HOW MUCH TO EAT? To decide how much of the food you can eat, you will need to consider both the number of calories in one serving and the size  of one serving. This information can be found on the Nutrition Facts label. If a food does not have a Nutrition Facts label, look up the information online or ask your dietitian for help. Remember that calories are listed per serving. If you choose to have more than one serving of a food, you will have to multiply the calories per serving by the amount of servings you plan to eat. For example, the label on a package of bread might say that a serving size is 1 slice and that there are 90 calories in a serving. If you eat 1 slice, you will have eaten 90 calories. If you eat 2 slices, you will have eaten 180 calories. HOW DO I KEEP A FOOD LOG? After each meal, record the following information in your food log:  What you ate.  How much of it you ate.  How many calories it had.  Then, add up your calories. Keep your food log near you, such as in a small notebook in your pocket. Another option is to use a mobile app or website. Some programs will calculate calories for you and show you how many calories you have left each time you add an item to the log. WHAT ARE SOME CALORIE COUNTING TIPS?  Use your calories on foods and drinks that will fill you up and not leave you hungry. Some examples of this include foods like nuts and nut butters, vegetables, lean proteins, and high-fiber foods (more than 5 g fiber per serving).  Eat nutritious foods and avoid empty calories. Empty calories are calories you get from foods or beverages that do not have many nutrients, such as candy and soda. It is better to have a nutritious high-calorie food (such as an avocado) than a food with few nutrients (such as a bag of chips).  Know how many calories are in the foods you eat most often. This way, you do not have to look up how many calories they have each time you eat them.  Look out for foods that may seem like low-calorie foods but are really high-calorie foods, such as baked goods, soda, and fat-free candy.  Pay  attention to calories in drinks. Drinks such as sodas, specialty coffee drinks, alcohol, and juices have a lot of calories yet do not fill you up. Choose low-calorie drinks like water and diet drinks.  Focus your calorie counting efforts on higher calorie items. Logging the calories in a garden salad that contains only vegetables is less important than calculating the calories in a milk shake.  Find a way of tracking calories that works for you. Get creative. Most people who are successful find ways to keep track of how much they eat in a day, even if they do not count every calorie. WHAT ARE SOME PORTION CONTROL TIPS?  Know how many calories are in a serving. This will help you know how many servings of a certain food you can have.  Use a measuring cup to measure serving sizes. This is helpful when you start out. With time, you will be able to estimate serving sizes for some foods.  Take some time to put servings of different foods on your favorite plates, bowls, and cups so you know what a serving looks like.  Try not to eat straight from a bag or box. Doing this can lead to overeating. Put the amount you would like to eat in a cup or on a plate to make sure you are eating the right portion.  Use smaller plates, glasses, and bowls to prevent overeating. This is a quick and easy way to practice portion control. If your plate is smaller, less food can fit on it.  Try not to multitask while eating, such as watching TV or using your computer. If it is time to eat, sit down at a table and enjoy your food. Doing this will help you to start recognizing when you are full. It will also make you more aware of what and how much you are eating. HOW CAN I CALORIE COUNT WHEN EATING OUT?  Ask for smaller portion sizes or child-sized portions.  Consider sharing an entree and sides instead of getting your own entree.  If you get your own entree, eat only half. Ask for a box at the beginning of your meal and  put the rest of your entree in it so you are not tempted to eat it.  Look for the calories on the menu. If calories are listed, choose the lower calorie options.  Choose dishes that include vegetables, fruits, whole grains, low-fat dairy products, and lean protein. Focusing on smart food choices from each of the 5 food groups can help you stay on track at restaurants.  Choose items that are boiled, broiled,  grilled, or steamed.  Choose water, milk, unsweetened iced tea, or other drinks without added sugars. If you want an alcoholic beverage, choose a lower calorie option. For example, a regular margarita can have up to 700 calories and a glass of wine has around 150.  Stay away from items that are buttered, battered, fried, or served with cream sauce. Items labeled "crispy" are usually fried, unless stated otherwise.  Ask for dressings, sauces, and syrups on the side. These are usually very high in calories, so do not eat much of them.  Watch out for salads. Many people think salads are a healthy option, but this is often not the case. Many salads come with bacon, fried chicken, lots of cheese, fried chips, and dressing. All of these items have a lot of calories. If you want a salad, choose a garden salad and ask for grilled meats or steak. Ask for the dressing on the side, or ask for olive oil and vinegar or lemon to use as dressing.  Estimate how many servings of a food you are given. For example, a serving of cooked rice is  cup or about the size of half a tennis ball or one cupcake wrapper. Knowing serving sizes will help you be aware of how much food you are eating at restaurants. The list below tells you how big or small some common portion sizes are based on everyday objects.  1 oz--4 stacked dice.  3 oz--1 deck of cards.  1 tsp--1 dice.  1 Tbsp-- a Ping-Pong ball.  2 Tbsp--1 Ping-Pong ball.   cup--1 tennis ball or 1 cupcake wrapper.  1 cup--1 baseball. Document Released:  02/22/2005 Document Revised: 07/09/2013 Document Reviewed: 12/28/2012 [facility] Patient Information 2015 [place], [place]. This information is not intended to replace advice given to you by your health care provider. Make sure you discuss any questions you have with your health care provider.    Breads, grains, pasta, cereal, rice, and beans.  Fats such as olive oil, trans fat-free margarine, canola oil, avocado, and olives. DOES EVERYONE WITH DIABETES MELLITUS HAVE THE SAME MEAL PLAN? Because every person with diabetes mellitus is different, there is not one meal plan that works for everyone. It is very important that you meet with a dietitian who will help you create a meal plan that is just right for you. Document Released: 11/19/2004 Document Revised: 02/27/2013 Document Reviewed: 01/19/2013 [facility] Patient Information 2015 [place], [place]. This information is not intended to replace advice given to you by your health care provider. Make sure you discuss any questions you have with your health care provider.  Exercise to Lose Weight Exercise and a healthy diet may help you lose weight. Your doctor may suggest specific exercises. EXERCISE IDEAS AND TIPS  Choose low-cost things you enjoy doing, such as walking, bicycling, or exercising to workout videos.  Take stairs instead of the elevator.  Walk during your lunch break.  Park your car further away from work or school.  Go to a gym or an exercise class.  Start with 5 to 10 minutes of exercise each day. Build up to 30 minutes of exercise 4 to 6 days a week.  Wear shoes with good support and comfortable clothes.  Stretch before and after working out.  Work out until you breathe harder and your heart beats faster.  Drink extra water when you exercise.  Do not do so much that you hurt yourself, feel dizzy, or get very short of breath. Exercises that burn about  150 calories:  Running 1  miles in 15 minutes.  Playing volleyball  for 45 to 60 minutes.  Washing and waxing a car for 45 to 60 minutes.  Playing touch football for 45 minutes.  Walking 1  miles in 35 minutes.  Pushing a stroller 1  miles in 30 minutes.  Playing basketball for 30 minutes.  Raking leaves for 30 minutes.  Bicycling 5 miles in 30 minutes.  Walking 2 miles in 30 minutes.  Dancing for 30 minutes.  Shoveling snow for 15 minutes.  Swimming laps for 20 minutes.  Walking up stairs for 15 minutes.  Bicycling 4 miles in 15 minutes.  Gardening for 30 to 45 minutes.  Jumping rope for 15 minutes.  Washing windows or floors for 45 to 60 minutes. Document Released: 03/27/2010 Document Revised: 05/17/2011 Document Reviewed: 03/27/2010 [facility] Patient Information 2015 [place]. This information is not intended to replace advice given to you by your health care provider. Make sure you discuss any questions you have with your health care provider.

## 2014-07-31 ENCOUNTER — Encounter: Payer: Self-pay | Admitting: Family Medicine

## 2014-09-05 ENCOUNTER — Encounter: Payer: BLUE CROSS/BLUE SHIELD | Attending: Family Medicine | Admitting: *Deleted

## 2014-09-05 ENCOUNTER — Encounter: Payer: Self-pay | Admitting: *Deleted

## 2014-09-05 DIAGNOSIS — Z713 Dietary counseling and surveillance: Secondary | ICD-10-CM | POA: Insufficient documentation

## 2014-09-05 DIAGNOSIS — Z6839 Body mass index (BMI) 39.0-39.9, adult: Secondary | ICD-10-CM | POA: Insufficient documentation

## 2014-09-05 DIAGNOSIS — E669 Obesity, unspecified: Secondary | ICD-10-CM | POA: Insufficient documentation

## 2014-09-05 NOTE — Progress Notes (Signed)
  Medical Nutrition Therapy:  Appt start time: 1100 end time:  1200.   Assessment:  Primary concerns today: Larry Hayden is here for nutrition counseling pertaining to referral for obesity.  During the standard depression screening, he revealed daily struggles with depression and feelings of hopelessness.  He does not speak [place] and has not found a social network here.  He Live with sisters, but he doesn't have friends outside of his sisters.  His depression is affecting his appetite and he only eats once a day.  He also smoke 1 ppd because he is stressed and upset.    His sister do the grocery shopping and cooking.  He might eat out 1-2 times/week: McDonald's or a diner.  When at home he eats together with his family in the living room while watching tv and is a fast eater.  He reports eating only once a day and gets really hungry so when he eats, he eats a lot.     Preferred Learning Style:   Auditory  Visual  Learning Readiness:   Ready   MEDICATIONS: see list   DIETARY INTAKE:  Usual eating pattern includes 1 meals and 0 snacks per day.  Avoided foods include pork, alcohol.    24-hr recall:  B ( AM): skips  Snk ( AM): nothing  L ( PM): skips Snk ( PM): nothing D ( PM): pizza, some traditional food: meat and rice.  Vegetables 3-4 days.  Fruits 2 times Snk ( PM): sometimes another meal Beverages: water, lemonade  Usual physical activity: none  Estimated energy needs: 2600 calories    Nutritional Diagnosis:  NB-1.1 Food and nutrition-related knowledge deficit As related to proper meal planning.  As evidenced by meal skipping and undesirable food choices.    Intervention:  Nutrition counseling provided.  Suggested mental health services and Larry Hayden accepted referral to local counseling agencies.  Also discussed smoking cessation. Advised 3 meals/day and to avoid meal skipping.  Client states lunch is not possible as he is too busy at work. Advised [employer]entials for lunch. Discussed MyPlate recommendations for meal planning  Teaching Method Utilized:  Visual Auditory   Handouts given during visit include:  Arabic MyPlate  Barriers to learning/adherence to lifestyle change: mental health  Demonstrated degree of understanding via:  Teach Back   Monitoring/Evaluation:  Dietary intake, exercise, and body weight in 1 month(s).

## 2014-09-05 NOTE — Patient Instructions (Signed)
[facility] [address] [place], [place] [address]  [phone]  [facility] Address: [address], [place], [place] [address]  [phone]  [facility]  Address: [address], [place], [place] [address]  [phone]

## 2014-10-10 ENCOUNTER — Encounter: Payer: Self-pay | Admitting: *Deleted

## 2014-10-10 ENCOUNTER — Encounter: Payer: BLUE CROSS/BLUE SHIELD | Attending: Family Medicine | Admitting: *Deleted

## 2014-10-10 DIAGNOSIS — Z713 Dietary counseling and surveillance: Secondary | ICD-10-CM | POA: Diagnosis not present

## 2014-10-10 DIAGNOSIS — E669 Obesity, unspecified: Secondary | ICD-10-CM | POA: Insufficient documentation

## 2014-10-10 DIAGNOSIS — Z6839 Body mass index (BMI) 39.0-39.9, adult: Secondary | ICD-10-CM | POA: Insufficient documentation

## 2014-10-10 NOTE — Progress Notes (Signed)
  Medical Nutrition Therapy:  Appt start time: 0800 end time:  0830.   Assessment:  Primary concerns today: Larry Hayden is here for nutrition counseling pertaining to referral for obesity.   Larry Hayden has been drinking the [employer].  He says his eating behaviors have improved- his sisters are cooking more and he's eating more. He is also eating more at [employer] he works in.  He can tell a difference in how he feels and he's doing much better.  However, his mental health is still suffering.  He works 11 hours/day and doesn't have time to make friends.  He is still trying to become fluent in [place] and he's embarrassed.  His sisters have made friends, but he's embarrassed to ask them to introduce him.  He wants to earn more money and get better established then try to make friends.  It still makes him sad, though, that he's so busy and lonely.  He states he doesn't have time to schedule counseling    Preferred Learning Style:   Auditory  Visual  Learning Readiness:   Change in progress   MEDICATIONS: see list   DIETARY INTAKE: Avoided foods include pork, alcohol.    Recall B: milk and eggs L: Acupuncturist S: 2 chicken wings and another [employer]ation 8 pm: another [employer] Midnight: leftovers or omelete Beverages: water  Rarely eats vegetables 1-2 days/week eats fruits Physically active job  Estimated energy needs: 2600 calories    Nutritional Diagnosis:  NB-1.1 Food and nutrition-related knowledge deficit As related to proper meal planning.  As evidenced by meal skipping and undesirable food choices.    Intervention:  Continue great work with eating.  His work doesn't allow for him to eat much so the Carnations work really well.  His work doesn't sell vegetables and he doesn't like the produce his sister buys at [employer].  Suggested the [employer] so they can buy and prepare more traditional vegetables that they all will like  better.  He liked that idea.  Stressed holistic self-care and not to ignore his mental health/emotional needs.  Don't work so hard that depression sets in   Teaching Method Utilized:  Auditory    Barriers to learning/adherence to lifestyle change: busy schedule  Demonstrated degree of understanding via:  Teach Back   Monitoring/Evaluation:  Dietary intake, exercise, and body weight prn.

## 2015-01-04 ENCOUNTER — Ambulatory Visit (INDEPENDENT_AMBULATORY_CARE_PROVIDER_SITE_OTHER): Payer: BLUE CROSS/BLUE SHIELD | Admitting: Family Medicine

## 2015-01-04 ENCOUNTER — Telehealth: Payer: Self-pay | Admitting: Family Medicine

## 2015-01-04 ENCOUNTER — Ambulatory Visit (HOSPITAL_COMMUNITY)
Admission: RE | Admit: 2015-01-04 | Discharge: 2015-01-04 | Disposition: A | Discharge status: Home / Self Care | Payer: BLUE CROSS/BLUE SHIELD | Source: Ambulatory Visit | Attending: Family Medicine | Admitting: Family Medicine

## 2015-01-04 VITALS — BP 120/80 | HR 74 | Temp 98.3°F | Resp 16 | Ht 69.0 in | Wt 247.0 lb

## 2015-01-04 DIAGNOSIS — R221 Localized swelling, mass and lump, neck: Secondary | ICD-10-CM

## 2015-01-04 DIAGNOSIS — K113 Abscess of salivary gland: Secondary | ICD-10-CM | POA: Diagnosis not present

## 2015-01-04 DIAGNOSIS — R938 Abnormal findings on diagnostic imaging of other specified body structures: Secondary | ICD-10-CM | POA: Diagnosis not present

## 2015-01-04 LAB — POCT CBC
Granulocyte percent: 65.3 %G (ref 37–80)
HEMATOCRIT: 47.1 % (ref 43.5–53.7)
Hemoglobin: 16 g/dL (ref 14.1–18.1)
LYMPH, POC: 2.4 (ref 0.6–3.4)
MCH, POC: 28 pg (ref 27–31.2)
MCHC: 33.9 g/dL (ref 31.8–35.4)
MCV: 82.5 fL (ref 80–97)
MID (cbc): 0.4 (ref 0–0.9)
MPV: 8.5 fL (ref 0–99.8)
PLATELET COUNT, POC: 220 10*3/uL (ref 142–424)
POC GRANULOCYTE: 5.1 (ref 2–6.9)
POC LYMPH %: 30.2 % (ref 10–50)
POC MID %: 4.5 %M (ref 0–12)
RBC: 5.71 M/uL (ref 4.69–6.13)
RDW, POC: 13.2 %
WBC: 7.8 10*3/uL (ref 4.6–10.2)

## 2015-01-04 LAB — POCT SEDIMENTATION RATE: POCT SED RATE: 12 mm/h (ref 0–22)

## 2015-01-04 MED ORDER — CIPROFLOXACIN HCL 750 MG PO TABS: 750.0000 mg | ORAL_TABLET | Freq: Two times a day (BID) | ORAL | Status: DC

## 2015-01-04 MED ORDER — CLINDAMYCIN HCL 150 MG PO CAPS: 450.0000 mg | ORAL_CAPSULE | Freq: Three times a day (TID) | ORAL | Status: DC

## 2015-01-04 MED ORDER — IOHEXOL 300 MG/ML  SOLN
100.0000 mL | Freq: Once | INTRAMUSCULAR | Status: AC | PRN
  Administered 2015-01-04: 100 mL via INTRAVENOUS

## 2015-01-04 MED ORDER — OXYCODONE-ACETAMINOPHEN 5-325 MG PO TABS: 1.0000 | ORAL_TABLET | Freq: Three times a day (TID) | ORAL | Status: DC | PRN

## 2015-01-04 NOTE — Progress Notes (Addendum)
Subjective:    Patient ID: Larry Hayden, male    DOB: 06-Dec-1985, 29 y.o.   MRN: [id number] This chart was scribed for [name], MD by Jolene Provost, Medical Scribe. This patient was seen in Room 11 and the patient's care was started a 9:12 AM.  Chief Complaint  Patient presents with   Mass    neck, x 3 days    HPI HPI Comments: Larry Hayden is a 29 y.o. male with a past hx of abscesses who presents to [facility] complaining of a visible mass in the left side of his lower jaw. It is very painful - exquisitely to the touch - first appeared 3d prior and gradually worsening. No overlying skin changes - denies any redness,warmth, or purulent drainage. He denies fever, chills, trouble swallowing, dental problems, changes in hearing, sore throat, dry mouth, pain with salivation, tmj problems, or h/o similar prior..     Pt states the took 1 antibiotic pill 2d ago without any relief - states name was "pain relieve" so suspect this may have been asap/nsaid actually rather than abx but limited by language barrier.  Pt's sister Francena Hanly is with him today and is translating.  She was ill with flu-like sxs x 2 wks and + strep test - seen here 3 times for this and sxs resolved last wk. No other sick contacts. He does have a h/o buttock abscess but felt and looked very different from this per pt.  Past Medical History  Diagnosis Date   Hyperparathyroidism (HCC)     Followed by Dr Elvera Lennox   No Known Allergies  No current outpatient prescriptions on file prior to visit.   No current facility-administered medications on file prior to visit.    Review of Systems  Constitutional: Negative for fever, chills, activity change, appetite change and unexpected weight change.  HENT: Positive for facial swelling. Negative for congestion, dental problem, drooling, ear discharge, ear pain, hearing loss, mouth sores, postnasal drip, rhinorrhea, sinus pressure, sore throat, trouble swallowing and voice change.     Respiratory: Negative for cough, choking, chest tightness, shortness of breath, wheezing and stridor.   Gastrointestinal: Negative for nausea, vomiting and abdominal pain.  Musculoskeletal: Positive for myalgias and neck pain. Negative for back pain, joint swelling, arthralgias, gait problem and neck stiffness.  Skin: Negative for color change, pallor, rash and wound.  Neurological: Negative for dizziness, facial asymmetry, speech difficulty, weakness, light-headedness, numbness and headaches.  Hematological: Positive for adenopathy. Does not bruise/bleed easily.      Objective:  BP 120/80 mmHg   Pulse 74   Temp(Src) 98.3 F (36.8 C) (Oral)   Resp 16   Ht  (1.753 m)   Wt 247 lb (112.038 kg)   BMI 36.46 kg/m2   SpO2 98%  Physical Exam  Constitutional: He is oriented to person, place, and time. He appears well-developed and well-nourished. No distress.  HENT:  Head: Normocephalic and atraumatic.  Poor dentition with dental carries, gingivitis, halitosis. Mass posterior to TMJ and posterior gum area.   Eyes: Pupils are equal, round, and reactive to light.  Neck: Neck supple.  Cardiovascular: Normal rate.   Pulmonary/Chest: Effort normal. No respiratory distress.  Musculoskeletal: Normal range of motion.  Neurological: He is alert and oriented to person, place, and time. Coordination normal.  Skin: Skin is warm and dry. He is not diaphoretic.  3 inch diameter tender fluctuant poorly defined mass over the angle of the mandible, extending posteriorly and anteriorly. Right TM normal,  left TM injected. Tonsils with mild erythema, 2+ no exudate.   Psychiatric: He has a normal mood and affect. His behavior is normal.  Nursing note and vitals reviewed.     Results for orders placed or performed in visit on 01/04/15  POCT CBC  Result Value Ref Range   WBC 7.8 4.6 - 10.2 K/uL   Lymph, poc 2.4 0.6 - 3.4   POC LYMPH PERCENT 30.2 10 - 50 %L   MID (cbc) 0.4 0 - 0.9   POC MID % 4.5 0 - 12 %M    POC Granulocyte 5.1 2 - 6.9   Granulocyte percent 65.3 37 - 80 %G   RBC 5.71 4.69 - 6.13 M/uL   Hemoglobin 16.0 14.1 - 18.1 g/dL   HCT, POC 19.147.1 47.843.5 - 53.7 %   MCV 82.5 80 - 97 fL   MCH, POC 28.0 27 - 31.2 pg   MCHC 33.9 31.8 - 35.4 g/dL   RDW, POC 29.513.2 %   Platelet Count, POC 220 142 - 424 K/uL   MPV 8.5 0 - 99.8 fL  POCT SEDIMENTATION RATE  Result Value Ref Range   POCT SED RATE 12 0 - 22 mm/hr   Ct Soft Tissue Neck W Contrast  01/04/2015  CLINICAL DATA:  Right neck mass and pain within the right parotid region. EXAM: CT NECK WITH CONTRAST TECHNIQUE: Multidetector CT imaging of the neck was performed using the standard protocol following the bolus administration of intravenous contrast. CONTRAST:  100mL OMNIPAQUE IOHEXOL 300 MG/ML  SOLN COMPARISON:  None. FINDINGS: Pharynx and Larynx: No evidence of mass. Salivary Glands: There is a 1.3 by 1.1 x 1.3 cm thick rim enhancing fluid-filled structure within the inferior portion of the right parotid gland. Thyroid:  No concerning nodule. Lymph nodes: No necrotic appearing lymph nodes. There is a 1.8 cm right submandibular lymph node with reactive appearance. 0.6 cm rounded lymph node is seen in the right submental region. Skeleton: No acute or aggressive findings. Upper Chest: No adenopathy or suspicious pulmonary nodule. IMPRESSION: 1.3 cm thick rim enhancing fluid-filled structure within the inferior portion of the right parotid gland, with associated mild likely reactive right submandibular and submental adenopathy. No evidence of associated sialolithiasis. Differential diagnosis includes parotid gland abscess, necrotic intraparotid lymph node, a necrotic parotid mass. Re-evaluation after empiric antibiotic treatment may be considered to assure resolution. Electronically Signed   By: Ted Mcalpineobrinka  Dimitrova M.D.   On: 01/04/2015 11:52    Assessment & Plan:   1. Mass of right side of neck   2. Parotid abscess   Stat ct now, then start below  double coverage cipro 750 bid and clinda 450 tid x 2 wks per UTD. Will need to monitor very closely with a very low threshold for sending to ER if  worsening at all - pt requested to f/u tomorrow with Urban GibsonMani who was able to also examine pt today.  If pt is clinically improving, cons repeat US assessment rather than CT in sev wks to ensure complete resolution. Pt's sister, Jannifer [place]ibr[place] Paulsen - (a pt of mine and an AMAZING woman fyi), interprets for pt and ok to call her cell for any communications/info - her cell is [phone]. Try sugar free hard candies, acidic or sour foods/drinks (lemon drops, hot tea with lemon) to increase activity of and blood flow to the parotid.  Orders Placed This Encounter  Procedures   CT Soft Tissue Neck W Contrast    Standing Status: Future  Number of Occurrences: 1     Standing Expiration Date: 04/05/2016    Order Specific Question:  If indicated for the ordered procedure, I authorize the administration of contrast media per Radiology protocol    Answer:  Yes    Order Specific Question:  Reason for Exam (SYMPTOM  OR DIAGNOSIS REQUIRED)    Answer:  mass posterior jaw    Order Specific Question:  Preferred imaging location?    Answer:  [facility]   POCT CBC   POCT SEDIMENTATION RATE    Meds ordered this encounter  Medications   ISOtretinoin (MYORISAN) 30 MG capsule    Sig: Take 30 mg by mouth 2 (two) times daily.   oxyCODONE-acetaminophen (ROXICET) 5-325 MG tablet    Sig: Take 1 tablet by mouth every 8 (eight) hours as needed for severe pain.    Dispense:  20 tablet    Refill:  0   ciprofloxacin (CIPRO) 750 MG tablet    Sig: Take 1 tablet (750 mg total) by mouth 2 (two) times daily.    Dispense:  28 tablet    Refill:  0   clindamycin (CLEOCIN) 150 MG capsule    Sig: Take 3 capsules (450 mg total) by mouth 3 (three) times daily.    Dispense:  126 capsule    Refill:  0    I personally performed the services described in this  documentation, which was scribed in my presence. The recorded information has been reviewed and considered, and addended by me as needed.  [name], MD MPH  By signing my name below, I, Javier Docker, attest that this documentation has been prepared under the direction and in the presence of [name], MD. Electronically Signed: Javier Docker, ER Scribe. 01/04/2015. 9:11 AM.  Language level caveat.

## 2015-01-04 NOTE — Telephone Encounter (Signed)
Called and spoke to [place], sister, about CT results. See report. Make sure pick up abx and pain medication. Start the abx today, use pain medication as needed. Also eat sugar free, hard candy, try more acidic flavors like lemon drops etc. This will help promote more saliva production, that will help bring more blood flow to the area, help drain pus if needed. Recheck with Gurney MaxinMike Mani tomorrow, Sunday 01/05/2015. She voiced understanding.   1.3 cm thick rim enhancing fluid-filled structure within the inferior portion of the right parotid gland, with associated mild likely reactive right submandibular and submental adenopathy. No evidence of associated sialolithiasis. Differential diagnosis includes parotid gland abscess, necrotic intraparotid lymph node, a necrotic parotid mass. Re-evaluation after empiric antibiotic treatment may be considered to assure resolution.

## 2015-01-04 NOTE — Patient Instructions (Addendum)
Go to [facility] at the Emergency Department for OUTPATIENT CT. DO NOT REGISTER AS ED PATIENT.   [facility]  [address], [place], [place] [address]  ph# [phone]   Parotitis  Parotitis means one or both of your parotid glands are sore and puffy (inflamed). The parotid glands make spit (saliva) in the mouth. HOME CARE  If you were given antibiotic medicines, take them as told. Finish them even if you start to feel better.  Put warm cloths (compresses) on the sore area.  Only take medicines as told by your doctor.  Drink enough fluids to keep your pee (urine) clear or pale yellow. GET HELP RIGHT AWAY IF:  You have more pain or puffiness (swelling) that is not helped by medicine.  You have a fever. MAKE SURE YOU:  Understand these instructions.  Will watch your condition.  Will get help right away if you are not doing well or get worse.   This information is not intended to replace advice given to you by your health care provider. Make sure you discuss any questions you have with your health care provider.   Document Released: 03/27/2010 Document Revised: 05/17/2011 Document Reviewed: 07/18/2014 Elsevier Interactive Patient Education 2016 Else[employer] Inc[employer] Parotitis Parotitis is soreness and inflammation of one or both parotid glands. The parotid glands produce saliva. They are located on each side of the face, below and in front of the earlobes. The saliva produced comes out of tiny openings (ducts) inside the cheeks. In most cases, parotitis goes away over time or with treatment. If your parotitis is caused by certain long-term (chronic) diseases, it may come back again.  CAUSES  Parotitis can be caused by:  Viral infections. Mumps is one viral infection that can cause parotitis.  Bacterial infections.  Blockage of the salivary ducts due to a salivary stone.  Narrowing of the salivary ducts.  Swelling of the salivary  ducts.  Dehydration.  Autoimmune conditions, such as sarcoidosis or Sjogren syndrome.  Air from activities such as scuba diving, glass blowing, or playing an instrument (rare).  Human immunodeficiency virus (HIV) or acquired immunodeficiency syndrome (AIDS).  Tuberculosis. SIGNS AND SYMPTOMS   The ears may appear to be pushed up and out from their normal position.  Redness (erythema) of the skin over the parotid glands.  Pain and tenderness over the parotid glands.  Swelling in the parotid gland area.  Yellowish-white fluid (pus) coming from the ducts inside the cheeks.  Dry mouth.  Bad taste in the mouth. DIAGNOSIS  Your health care provider may determine that you have parotitis based on your symptoms and a physical exam. A sample of fluid may also be taken from the parotid gland and tested to find the cause of your infection. X-rays or computed tomography (CT) scans may be taken if your health care provider thinks you might have a salivary stone blocking your salivary duct. TREATMENT  Treatment varies depending upon the cause of your parotitis. If your parotitis is caused by mumps, no treatment is needed. The condition will go away on its own after 7 to 10 days. In other cases, treatment may include:  Antibiotic medicine if your infection was caused by bacteria.  Pain medicines.  Gland massage.  Eating sour candy to increase your saliva production.  Removal of salivary stones. Your health care provider may flush stones out with fluids or remove them with tweezers.  Surgery to remove the parotid glands. HOME CARE INSTRUCTIONS   If  you were prescribed an antibiotic medicine, finish it all even if you start to feel better.  Put warm compresses on the sore area.  Take medicines only as directed by your health care provider.  Drink enough fluids to keep your urine clear or pale yellow. SEEK IMMEDIATE MEDICAL CARE IF:   You have increasing pain or swelling that is not  controlled with medicine.  You have a fever. MAKE SURE YOU:  Understand these instructions.  Will watch your condition.  Will get help right away if you are not doing well or get worse.   This information is not intended to replace advice given to you by your health care provider. Make sure you discuss any questions you have with your health care provider.   Document Released: 08/14/2001 Document Revised: 03/15/2014 Document Reviewed: 07/18/2014 Elsevier Interactive Patient Education [employer]

## 2015-01-05 ENCOUNTER — Ambulatory Visit (INDEPENDENT_AMBULATORY_CARE_PROVIDER_SITE_OTHER): Payer: BLUE CROSS/BLUE SHIELD | Admitting: Urgent Care

## 2015-01-05 VITALS — BP 116/70 | HR 70 | Temp 98.1°F | Resp 16 | Ht 69.0 in | Wt 248.0 lb

## 2015-01-05 DIAGNOSIS — K113 Abscess of salivary gland: Secondary | ICD-10-CM

## 2015-01-05 DIAGNOSIS — R221 Localized swelling, mass and lump, neck: Secondary | ICD-10-CM | POA: Diagnosis not present

## 2015-01-05 NOTE — Patient Instructions (Signed)
Parotitis Parotitis is soreness and inflammation of one or both parotid glands. The parotid glands produce saliva. They are located on each side of the face, below and in front of the earlobes. The saliva produced comes out of tiny openings (ducts) inside the cheeks. In most cases, parotitis goes away over time or with treatment. If your parotitis is caused by certain long-term (chronic) diseases, it may come back again.  CAUSES  Parotitis can be caused by:  Viral infections. Mumps is one viral infection that can cause parotitis.  Bacterial infections.  Blockage of the salivary ducts due to a salivary stone.  Narrowing of the salivary ducts.  Swelling of the salivary ducts.  Dehydration.  Autoimmune conditions, such as sarcoidosis or Sjogren syndrome.  Air from activities such as scuba diving, glass blowing, or playing an instrument (rare).  Human immunodeficiency virus (HIV) or acquired immunodeficiency syndrome (AIDS).  Tuberculosis. SIGNS AND SYMPTOMS   The ears may appear to be pushed up and out from their normal position.  Redness (erythema) of the skin over the parotid glands.  Pain and tenderness over the parotid glands.  Swelling in the parotid gland area.  Yellowish-white fluid (pus) coming from the ducts inside the cheeks.  Dry mouth.  Bad taste in the mouth. DIAGNOSIS  Your health care provider may determine that you have parotitis based on your symptoms and a physical exam. A sample of fluid may also be taken from the parotid gland and tested to find the cause of your infection. X-rays or computed tomography (CT) scans may be taken if your health care provider thinks you might have a salivary stone blocking your salivary duct. TREATMENT  Treatment varies depending upon the cause of your parotitis. If your parotitis is caused by mumps, no treatment is needed. The condition will go away on its own after 7 to 10 days. In other cases, treatment may  include:  Antibiotic medicine if your infection was caused by bacteria.  Pain medicines.  Gland massage.  Eating sour candy to increase your saliva production.  Removal of salivary stones. Your health care provider may flush stones out with fluids or remove them with tweezers.  Surgery to remove the parotid glands. HOME CARE INSTRUCTIONS   If you were prescribed an antibiotic medicine, finish it all even if you start to feel better.  Put warm compresses on the sore area.  Take medicines only as directed by your health care provider.  Drink enough fluids to keep your urine clear or pale yellow. SEEK IMMEDIATE MEDICAL CARE IF:   You have increasing pain or swelling that is not controlled with medicine.  You have a fever. MAKE SURE YOU:  Understand these instructions.  Will watch your condition.  Will get help right away if you are not doing well or get worse.   This information is not intended to replace advice given to you by your health care provider. Make sure you discuss any questions you have with your health care provider.   Document Released: 08/14/2001 Document Revised: 03/15/2014 Document Reviewed: 07/18/2014 Elsevier Interactive Patient Education 2016 Elsevier Inc.  

## 2015-01-05 NOTE — Progress Notes (Signed)
    MRN: [id number] DOB: 1985-11-09  Subjective:   Larry Hayden is a 29 y.o. male presenting for follow up on parotitis, abscess. Reports improvement in his pain and swelling. The patient has started both ciprofloxacin and clindamycin. He did not take any pain medication as he felt he really didn't need it. Denies fever, worsening of his swelling, redness, cough, sore throat, ear pain. Denies any other aggravating or relieving factors, no other questions or concerns.  [name] has a current medication list which includes the following prescription(s): ciprofloxacin, clindamycin, isotretinoin, and oxycodone-acetaminophen. Also has No Known Allergies.  [name]  has a past medical history of Hyperparathyroidism (HCC). Also  has no past surgical history on file.  Objective:   Vitals: BP 116/70 mmHg  Pulse 70  Temp(Src) 98.1 F (36.7 C) (Oral)  Resp 16  Ht 5\' 9"  (1.753 m)  Wt 248 lb (112.492 kg)  BMI 36.61 kg/m2  SpO2 98%  Physical Exam  Constitutional: He is oriented to person, place, and time. He appears well-developed and well-nourished.  HENT:  Mouth/Throat: Oropharynx is clear and moist.  TMs intact and without erythema.  Eyes: Right eye exhibits no discharge. Left eye exhibits no discharge. No scleral icterus.  Neck:    Cardiovascular: Normal rate, regular rhythm and intact distal pulses.  Exam reveals no gallop and no friction rub.   No murmur heard. Pulmonary/Chest: No respiratory distress. He has no wheezes. He has no rales.  Lymphadenopathy:    He has no cervical adenopathy.  Neurological: He is alert and oriented to person, place, and time.  Skin: Skin is warm and dry. No rash noted. No erythema. No pallor.   Assessment and Plan :   1. Parotid abscess 2. Mass of right side of neck - Improving, continue antibiotic therapy. Recommend the patient use pain medication as needed. He is to followup with Dr. Clelia CroftShaw on Tuesday, 01/07/2015. Counseled him on worsening signs and  symptoms of infection, patient is to return to clinic if these develop. I advised him that we would have a low threshold to send him to the ED for more aggressive treatment if this occurs. Patient and his sister, who is translating for us, are in agreement.  Wallis BambergMario Shataya Winkles, PA-C [facility] [phone] 01/05/2015 11:28 AM

## 2015-01-07 ENCOUNTER — Ambulatory Visit (INDEPENDENT_AMBULATORY_CARE_PROVIDER_SITE_OTHER): Payer: BLUE CROSS/BLUE SHIELD | Admitting: Family Medicine

## 2015-01-07 VITALS — BP 122/68 | HR 79 | Temp 97.2°F | Resp 18 | Ht 69.0 in | Wt 247.2 lb

## 2015-01-07 DIAGNOSIS — K113 Abscess of salivary gland: Secondary | ICD-10-CM

## 2015-01-07 DIAGNOSIS — R0981 Nasal congestion: Secondary | ICD-10-CM

## 2015-01-07 DIAGNOSIS — R590 Localized enlarged lymph nodes: Secondary | ICD-10-CM | POA: Diagnosis not present

## 2015-01-07 MED ORDER — SALINE SPRAY 0.65 % NA SOLN: 1.0000 | NASAL | Status: DC

## 2015-01-07 NOTE — Progress Notes (Signed)
Subjective:  This chart was scribed for Norberto SorensonEva Shaw MD, by Veverly FellsHatice Demirci,scribe, at [facility].  This patient was seen in room 1 and the patient's care was started at 2:32 PM.    Patient ID: Larry Hayden, male    DOB: Apr 23, 1985, 29 y.o.   MRN: [id number] Chief Complaint  Patient presents with  . Follow-up    recheck abscesses (right side)  . Depression    see screening    HPI  HPI Comments: Larry Hayden is a 29 y.o. male who presents to the [facility] for a follow up.  Patient feels much better now and denies any change in mouth or detention.  States that he has decreased pain and swelling.  No fever/chills or difficulty swallowing.  He has been started on Isotretinoin for nodular acne buy dermatology who will keep him on it for 6 months.  He is on month two and dose was recently increased from 20 to 30 mgs. It has been causing him a lot of irritation of his mucous membranes.  He also has been having nasal congestion that varies intermittently between his right and left sides for years.  He saw ENT who put him on a nasal spray (does not know what type) used for two weeks and states that it did not help.  He has no other concerns or complaints today.     Past Medical History  Diagnosis Date  . Hyperparathyroidism (HCC)     Followed by Dr Elvera LennoxGherghe     Current Outpatient Prescriptions on File Prior to Visit  Medication Sig Dispense Refill  . ciprofloxacin (CIPRO) 750 MG tablet Take 1 tablet (750 mg total) by mouth 2 (two) times daily. 28 tablet 0  . clindamycin (CLEOCIN) 150 MG capsule Take 3 capsules (450 mg total) by mouth 3 (three) times daily. 126 capsule 0  . ISOtretinoin (MYORISAN) 30 MG capsule Take 30 mg by mouth 2 (two) times daily.     No current facility-administered medications on file prior to visit.    No Known Allergies    Depression screen [facility] 2/9 01/07/2015 01/05/2015 01/04/2015 10/10/2014 09/05/2014  Decreased Interest  2 0 0 1 3  Down, Depressed, Hopeless 2 0 0 1 3  PHQ - 2 Score 4 0 0 2 6  Altered sleeping 2 - - - -  Tired, decreased energy 1 - - - -  Change in appetite 2 - - - -  Feeling bad or failure about yourself  0 - - - -  Trouble concentrating 0 - - - -  Moving slowly or fidgety/restless 0 - - - -  Suicidal thoughts 0 - - - -  PHQ-9 Score 9 - - - -        Review of Systems  Constitutional: Positive for appetite change and fatigue. Negative for fever, chills and activity change.  HENT: Positive for congestion. Negative for trouble swallowing.   Eyes: Negative for pain and redness.  Respiratory: Negative for cough and shortness of breath.   Gastrointestinal: Negative for nausea and vomiting.  Musculoskeletal: Negative for neck pain and neck stiffness.  Skin: Positive for wound. Negative for color change and rash.  Psychiatric/Behavioral: Positive for sleep disturbance and dysphoric mood. Negative for suicidal ideas, self-injury and decreased concentration.       Objective:   Physical Exam  Constitutional: He is oriented to person, place, and time. He appears well-developed and well-nourished. No distress.  HENT:  Head:  Normocephalic and atraumatic.  Right parotid gland is hugely improved, non tender, no defined mass, some subtle fullness but still with submandibular and anterior cervical nodes on the right which were quite firm but non tender. No overlying skin changes.  Nares bilaterally showed turbinates, erythema and edema.   Eyes: Pupils are equal, round, and reactive to light.  Pulmonary/Chest: No respiratory distress.  Neurological: He is alert and oriented to person, place, and time.  Skin:  Some bleeding raw areas on his lips  Two erythematous nodular acne areas on his right cheek.   Psychiatric: He has a normal mood and affect. His behavior is normal.   Filed Vitals:   01/07/15 1348  BP: 122/68  Pulse: 79  Temp: 97.2 F (36.2 C)  TempSrc: Oral  Resp: 18  Height:   (1.753 m)  Weight: 247 lb 3.2 oz (112.129 kg)  SpO2: 98%          Assessment & Plan:   1. Abscess of parotid gland   2. Lymphadenopathy of right cervical region   3. Nasal congestion - failed unknown nasal spray x 2 wks from ENT - assume nasal steroid - try nasal saline req. Pre-dated isotrentoin started for acne by derm 2 mos prior.  Doing well - on day 5 of 20 on cipro anf and clinda - finish course then recheck in office to ensure resolution and order [place] if improved to ensure no underlying abnormality  Meds ordered this encounter  Medications  . sodium chloride (OCEAN) 0.65 % SOLN nasal spray    Sig: Place 1 spray into both nostrils every 2 (two) hours while awake.    Dispense:  60 mL    Refill:  0    I personally performed the services described in this documentation, which was scribed in my presence. The recorded information has been reviewed and considered, and addended by me as needed.  [name], MD MPH

## 2015-01-07 NOTE — Patient Instructions (Signed)
Parotitis  Parotitis means one or both of your parotid glands are sore and puffy (inflamed). The parotid glands make spit (saliva) in the mouth. HOME CARE  If you were given antibiotic medicines, take them as told. Finish them even if you start to feel better.  Put warm cloths (compresses) on the sore area.  Only take medicines as told by your doctor.  Drink enough fluids to keep your pee (urine) clear or pale yellow. GET HELP RIGHT AWAY IF:  You have more pain or puffiness (swelling) that is not helped by medicine.  You have a fever. MAKE SURE YOU:  Understand these instructions.  Will watch your condition.  Will get help right away if you are not doing well or get worse.   This information is not intended to replace advice given to you by your health care provider. Make sure you discuss any questions you have with your health care provider.   Document Released: 03/27/2010 Document Revised: 05/17/2011 Document Reviewed: 07/18/2014 Elsevier Interactive Patient Education [employer]2016 Elsevier Inc.

## 2015-01-21 ENCOUNTER — Ambulatory Visit (INDEPENDENT_AMBULATORY_CARE_PROVIDER_SITE_OTHER): Payer: BLUE CROSS/BLUE SHIELD | Admitting: Family Medicine

## 2015-01-21 VITALS — BP 120/72 | HR 80 | Temp 97.9°F | Resp 18 | Ht 72.0 in | Wt 245.4 lb

## 2015-01-21 DIAGNOSIS — E559 Vitamin D deficiency, unspecified: Secondary | ICD-10-CM

## 2015-01-21 DIAGNOSIS — F39 Unspecified mood [affective] disorder: Secondary | ICD-10-CM | POA: Diagnosis not present

## 2015-01-21 DIAGNOSIS — K113 Abscess of salivary gland: Secondary | ICD-10-CM

## 2015-01-21 DIAGNOSIS — E213 Hyperparathyroidism, unspecified: Secondary | ICD-10-CM | POA: Diagnosis not present

## 2015-01-21 LAB — POCT CBC
GRANULOCYTE PERCENT: 61.9 % (ref 37–80)
HEMATOCRIT: 47.3 % (ref 43.5–53.7)
Hemoglobin: 16 g/dL (ref 14.1–18.1)
Lymph, poc: 2.4 (ref 0.6–3.4)
MCH, POC: 27.8 pg (ref 27–31.2)
MCHC: 33.9 g/dL (ref 31.8–35.4)
MCV: 82 fL (ref 80–97)
MID (cbc): 0.4 (ref 0–0.9)
MPV: 8.8 fL (ref 0–99.8)
POC GRANULOCYTE: 4.6 (ref 2–6.9)
POC LYMPH %: 32.2 % (ref 10–50)
POC MID %: 5.9 %M (ref 0–12)
Platelet Count, POC: 207 10*3/uL (ref 142–424)
RBC: 5.77 M/uL (ref 4.69–6.13)
RDW, POC: 13 %
WBC: 7.5 10*3/uL (ref 4.6–10.2)

## 2015-01-21 LAB — COMPREHENSIVE METABOLIC PANEL
ALBUMIN: 4.4 g/dL (ref 3.6–5.1)
ALT: 37 U/L (ref 9–46)
AST: 22 U/L (ref 10–40)
Alkaline Phosphatase: 76 U/L (ref 40–115)
BUN: 19 mg/dL (ref 7–25)
CALCIUM: 10.6 mg/dL — AB (ref 8.6–10.3)
CHLORIDE: 103 mmol/L (ref 98–110)
CO2: 26 mmol/L (ref 20–31)
Creat: 0.59 mg/dL — ABNORMAL LOW (ref 0.60–1.35)
Glucose, Bld: 130 mg/dL — ABNORMAL HIGH (ref 65–99)
Potassium: 3.8 mmol/L (ref 3.5–5.3)
SODIUM: 138 mmol/L (ref 135–146)
Total Bilirubin: 0.4 mg/dL (ref 0.2–1.2)
Total Protein: 7 g/dL (ref 6.1–8.1)

## 2015-01-21 NOTE — Progress Notes (Deleted)
Subjective:    Patient ID: Larry Hayden, male    DOB: 1985-09-03, 29 y.o.   MRN: [id number] This chart was scribed for Larry Sorenson, MD by Larry Hayden, Medical Scribe. This patient was seen in Room 1 and the patient's care was started at 2:00 PM.   Chief Complaint  Patient presents with   Follow-up    cyst     HPI HPI Comments: Larry Hayden is a 29 y.o. male who presents to the [facility] for a follow-up for a cyst. Patient had an acute abscess of the right inferior parotid gland a little over 2 weeks ago. No stone. CT scan showed possible abscess, necrotic lymph node, or necrotic parotid mass; recommended re-evaluation after antibiotic to ensure resolution. He was treated with Cipro 750 mg BID x 2 weeks and clindamycin 450 mg TID x 2 weeks. He has an appointment scheduled with me in 2 days.   He is being worked up by Dr. Elvera Hayden for hyperparathyroidism with mildly elevated calcium and PTH. It was discussed that this was done in the presence of vitamin D deficiency, so lab testing would need to be repeated after vitamin D has been restored, which has not yet been the case. Patient is no longer taking vitamin D. He has not seen his PCP, Dr. Orpah Hayden, in over a year and would like to transfer his care to me.  Patient states he does not have any pain to his neck. He has finished the antibiotics. He has had some mild diarrhea. He denies dry mouth and difficulty swallowing.  Patient states his mood has been so-so. He has never been on any medications for depression or anxiety.  Review of Systems  HENT: Negative for trouble swallowing.   Musculoskeletal: Negative for neck pain.       Objective:  BP 120/72 mmHg   Pulse 80   Temp(Src) 97.9 F (36.6 C) (Oral)   Resp 18   Ht 6' (1.829 m)   Wt 245 lb 6.4 oz (111.313 kg)   BMI 33.28 kg/m2   SpO2 98%  Physical Exam  Constitutional: He is oriented to person, place, and time. He appears well-developed and well-nourished. No  distress.  HENT:  Head: Normocephalic and atraumatic.  Mouth/Throat: Oropharynx is clear and moist. No oropharyngeal exudate.  Eyes: Pupils are equal, round, and reactive to light.  Neck: Neck supple.  Cardiovascular: Normal rate.   Pulmonary/Chest: Effort normal.  Musculoskeletal: He exhibits no edema.  Neurological: He is alert and oriented to person, place, and time. No cranial nerve deficit.  Skin: Skin is warm and dry. No rash noted.  Psychiatric: He has a normal mood and affect. His behavior is normal.  Nursing note and vitals reviewed.         Assessment & Plan:   1. Abscess of parotid gland   2. Vitamin D deficiency -   3. Hypercalcemia   4. Hyperparathyroidism (HCC)   5. Episodic mood disorder (HCC)     Orders Placed This Encounter  Procedures   CT Soft Tissue Neck W Contrast    Standing Status: Future     Number of Occurrences:      Standing Expiration Date: 04/22/2016    Scheduling Instructions:     Please schedule 1 week from today 01-21-15    Order Specific Question:  If indicated for the ordered procedure, I authorize the administration of contrast media per Radiology protocol    Answer:  Yes  Order Specific Question:  Reason for Exam (SYMPTOM  OR DIAGNOSIS REQUIRED)    Answer:  abcess parotid gland lymphadnopathy of the right cervical region    Order Specific Question:  Preferred imaging location?    Answer:  [id number]    Order Specific Question:  Call Report- Best Contact Number?    Answer:  [phone]:  [phone]   Comprehensive metabolic panel   VITAMIN D 25 Hydroxy (Vit-D Deficiency, Fractures)   POCT CBC     I personally performed the services described in this documentation, which was scribed in my presence. The recorded information has been reviewed and considered, and addended by me as needed.  Larry SorensonEva Shaw, MD MPH  By signing my name below, I, Larry Hayden, attest that this documentation has been prepared under the direction and in the  presence of Larry SorensonEva Shaw, MD.  Electronically Signed: Littie Deedsichard Hayden, Medical Scribe. 01/21/2015. 2:08 PM.

## 2015-01-21 NOTE — Patient Instructions (Addendum)
Continue eating a yogurt every day.  Start taking 2000u a day of otc vit D - you will probably need to take this permanently. Adjustment Disorder Adjustment disorder is an unusually severe reaction to a stressful life event, such as the loss of a job or physical illness or moving away from family. The event may be any stressful event other than the loss of a loved one. Adjustment disorder may affect your feelings, your thinking, how you act, or a combination of these. It may interfere with personal relationships or with the way you are at work, school, or home. People with this disorder are at risk for suicide and substance abuse. They may develop a more serious mental disorder, such as major depressive disorder or post-traumatic stress disorder. SIGNS AND SYMPTOMS  Symptoms may include:  Sadness, depressed mood, or crying spells.  Loss of enjoyment.  Change in appetite or weight.  Sense of loss or hopelessness.  Thoughts of suicide.  Anxiety, worry, or nervousness.  Trouble sleeping.  Avoiding family and friends.  Poor school performance.  Fighting or vandalism.  Reckless driving.  Skipping school.  Poor work International aid/development worker.  Ignoring bills. Symptoms of adjustment disorder start within 3 months of the stressful life event. They do not last more than 6 months after the event has ended. DIAGNOSIS  To make a diagnosis, your health care provider will ask about what has happened in your life and how it has affected you. He or she may also ask about your medical history and use of medicines, alcohol, and other substances. Your health care provider may do a physical exam and order lab tests or other studies. You may be referred to a mental health specialist for evaluation. TREATMENT  Treatment options include:  Counseling or talk therapy. Talk therapy is usually provided by mental health specialists.  Medicine. Certain medicines may help with depression, anxiety, and sleep.  Support  groups. Support groups offer emotional support, advice, and guidance. They are made up of people who have had similar experiences. HOME CARE INSTRUCTIONS  Keep all follow-up visits as directed by your health care provider. This is important.  Take medicines only as directed by your health care provider. SEEK MEDICAL CARE IF:  Your symptoms get worse.  SEEK IMMEDIATE MEDICAL CARE IF: You have serious thoughts about hurting yourself or someone else. MAKE SURE YOU:  Understand these instructions.  Will watch your condition.  Will get help right away if you are not doing well or get worse.   This information is not intended to replace advice given to you by your health care provider. Make sure you discuss any questions you have with your health care provider.   Document Released: 10/27/2005 Document Revised: 03/15/2014 Document Reviewed: 07/17/2013 Elsevier Interactive Patient Education 2016 Elsevier Inc.  Major Depressive Disorder Major depressive disorder is a mental illness. It also may be called clinical depression or unipolar depression. Major depressive disorder usually causes feelings of sadness, hopelessness, or helplessness. Some people with this disorder do not feel particularly sad but lose interest in doing things they used to enjoy (anhedonia). Major depressive disorder also can cause physical symptoms. It can interfere with work, school, relationships, and other normal everyday activities. The disorder varies in severity but is longer lasting and more serious than the sadness we all feel from time to time in our lives. Major depressive disorder often is triggered by stressful life events or major life changes. Examples of these triggers include divorce, loss of your job  or home, a move, and the death of a family member or close friend. Sometimes this disorder occurs for no obvious reason at all. People who have family members with major depressive disorder or bipolar disorder are  at higher risk for developing this disorder, with or without life stressors. Major depressive disorder can occur at any age. It may occur just once in your life (single episode major depressive disorder). It may occur multiple times (recurrent major depressive disorder). SYMPTOMS People with major depressive disorder have either anhedonia or depressed mood on nearly a daily basis for at least 2 weeks or longer. Symptoms of depressed mood include:  Feelings of sadness (blue or down in the dumps) or emptiness.  Feelings of hopelessness or helplessness.  Tearfulness or episodes of crying (may be observed by others).  Irritability (children and adolescents). In addition to depressed mood or anhedonia or both, people with this disorder have at least four of the following symptoms:  Difficulty sleeping or sleeping too much.   Significant change (increase or decrease) in appetite or weight.   Lack of energy or motivation.  Feelings of guilt and worthlessness.   Difficulty concentrating, remembering, or making decisions.  Unusually slow movement (psychomotor retardation) or restlessness (as observed by others).   Recurrent wishes for death, recurrent thoughts of self-harm (suicide), or a suicide attempt. People with major depressive disorder commonly have persistent negative thoughts about themselves, other people, and the world. People with severe major depressive disorder may experiencedistorted beliefs or perceptions about the world (psychotic delusions). They also may see or hear things that are not real (psychotic hallucinations). DIAGNOSIS Major depressive disorder is diagnosed through an assessment by your health care provider. Your health care provider will ask aboutaspects of your daily life, such as mood,sleep, and appetite, to see if you have the diagnostic symptoms of major depressive disorder. Your health care provider may ask about your medical history and use of alcohol or  drugs, including prescription medicines. Your health care provider also may do a physical exam and blood work. This is because certain medical conditions and the use of certain substances can cause major depressive disorder-like symptoms (secondary depression). Your health care provider also may refer you to a mental health specialist for further evaluation and treatment. TREATMENT It is important to recognize the symptoms of major depressive disorder and seek treatment. The following treatments can be prescribed for this disorder:   Medicine. Antidepressant medicines usually are prescribed. Antidepressant medicines are thought to correct chemical imbalances in the brain that are commonly associated with major depressive disorder. Other types of medicine may be added if the symptoms do not respond to antidepressant medicines alone or if psychotic delusions or hallucinations occur.  Talk therapy. Talk therapy can be helpful in treating major depressive disorder by providing support, education, and guidance. Certain types of talk therapy also can help with negative thinking (cognitive behavioral therapy) and with relationship issues that trigger this disorder (interpersonal therapy). A mental health specialist can help determine which treatment is best for you. Most people with major depressive disorder do well with a combination of medicine and talk therapy. Treatments involving electrical stimulation of the brain can be used in situations with extremely severe symptoms or when medicine and talk therapy do not work over time. These treatments include electroconvulsive therapy, transcranial magnetic stimulation, and vagal nerve stimulation.   This information is not intended to replace advice given to you by your health care provider. Make sure you discuss any questions you  have with your health care provider.   Document Released: 06/19/2012 Document Revised: 03/15/2014 Document Reviewed:  06/19/2012 Elsevier Interactive Patient Education [employer]

## 2015-01-21 NOTE — Progress Notes (Addendum)
Subjective:    Patient ID: Larry Hayden, male    DOB: 11-Sep-1985, 29 y.o.   MRN: [id number] This chart was scribed for [name], MD by Littie Deeds, Medical Scribe. This patient was seen in Room 1 and the patient's care was started at 2:00 PM.   Chief Complaint  Patient presents with  . Follow-up    cyst     HPI HPI Comments: Larry Hayden is a 29 y.o. male who presents to the [facility] for a follow-up for a cyst. Patient had an acute abscess of the right inferior parotid gland a little over 2 weeks ago. No stone. CT scan showed possible abscess, necrotic lymph node, or necrotic parotid mass; recommended re-evaluation after antibiotic to ensure resolution. He was treated with Cipro 750 mg BID x 2 weeks and clindamycin 450 mg TID x 2 weeks. He has an appointment scheduled with me in 2 days.   He is being worked up by Dr. Elvera Lennox for hyperparathyroidism with mildly elevated calcium and PTH. It was discussed that this was done in the presence of vitamin D deficiency, so lab testing would need to be repeated after vitamin D has been restored, which has not yet been the case. Patient is no longer taking vitamin D. He has not seen his PCP, Dr. Orpah Cobb, in over a year and would like to transfer his care to me.  Patient states he does not have any pain to his neck. He has finished the antibiotics. He has had some mild diarrhea. He denies dry mouth and difficulty swallowing.  Patient states his mood has been so-so. He has never been on any medications for depression or anxiety.   Past Medical History  Diagnosis Date  . Hyperparathyroidism (HCC)     Followed by Dr Elvera Lennox   Current Outpatient Prescriptions on File Prior to Visit  Medication Sig Dispense Refill  . ISOtretinoin (MYORISAN) 30 MG capsule Take 30 mg by mouth 2 (two) times daily.    . sodium chloride (OCEAN) 0.65 % SOLN nasal spray Place 1 spray into both nostrils every 2 (two) hours while awake. 60 mL 0    No current facility-administered medications on file prior to visit.   No Known Allergies   Depression screen [facility] 2/9 01/21/2015 01/07/2015 01/05/2015 01/04/2015 10/10/2014  Decreased Interest 0 2 0 0 1  Down, Depressed, Hopeless 0 2 0 0 1  PHQ - 2 Score 0 4 0 0 2  Altered sleeping 0 2 - - -  Tired, decreased energy 0 1 - - -  Change in appetite 0 2 - - -  Feeling bad or failure about yourself  0 0 - - -  Trouble concentrating 0 0 - - -  Moving slowly or fidgety/restless 0 0 - - -  Suicidal thoughts 0 0 - - -  PHQ-9 Score 0 9 - - -    / Review of Systems  Constitutional: Negative for fever, chills, diaphoresis, activity change, appetite change, fatigue and unexpected weight change.  HENT: Negative for dental problem, drooling, ear discharge, ear pain, facial swelling, hearing loss, mouth sores, nosebleeds, sore throat, trouble swallowing and voice change.   Respiratory: Negative for cough and choking.   Musculoskeletal: Negative for neck pain.  Skin: Negative for color change, pallor, rash and wound.  Hematological: Negative for adenopathy. Does not bruise/bleed easily.  Psychiatric/Behavioral: Negative for dysphoric mood. The patient is not nervous/anxious.        Objective:  BP  120/72 mmHg  Pulse 80  Temp(Src) 97.9 F (36.6 C) (Oral)  Resp 18  Ht 6' (1.829 m)  Wt 245 lb 6.4 oz (111.313 kg)  BMI 33.28 kg/m2  SpO2 98%  Physical Exam  Constitutional: He is oriented to person, place, and time. He appears well-developed and well-nourished. No distress.  HENT:  Head: Normocephalic and atraumatic.  Mouth/Throat: Oropharynx is clear and moist. No oropharyngeal exudate.  Eyes: Pupils are equal, round, and reactive to light.  Neck: Neck supple.  Cardiovascular: Normal rate.   Pulmonary/Chest: Effort normal.  Musculoskeletal: He exhibits no edema.  Neurological: He is alert and oriented to person, place, and time. No cranial nerve deficit.  Skin: Skin is warm and dry. No  rash noted.  Psychiatric: He has a normal mood and affect. His behavior is normal.  Nursing note and vitals reviewed.         Assessment & Plan:   1. Abscess of parotid gland - resolved, suspect abscess since presented so acutely inflammatory and has now completely resolved after 2 wk abx course of cipro and clinda.  Reviewed with radiology that CT scan is going to be best imaging option to ensure abnormality is resolved - US likely to technologist dependent as done infreq.  2. Vitamin D deficiency - start 2000u otc - recheck - lower than prior, restart high dose once weekly, then resume otc  3. Hypercalcemia - due to mild benign hyperparathyroid per pt's endocrine Dr. Elvera LennoxGherghe but repeating labs after vit D has been fully replaced would help confirm diagnosis.   4. Hyperparathyroidism (HCC) -   5. Episodic mood disorder (HCC) - has never been diagnosed or tried meds prior but prior phq9s have been c/w depression but appear to have resolved today - unfortunately, this is difficult to discuss in detail wif ith pt due to language barrier with sister translating.  Advised discussing more at f/u sxs cont which pt is agreeable to.    Orders Placed This Encounter  Procedures  . CT Soft Tissue Neck W Contrast    Standing Status: Future     Number of Occurrences:      Standing Expiration Date: 04/22/2016    Scheduling Instructions:     Please schedule 1 week from today 01-21-15    Order Specific Question:  If indicated for the ordered procedure, I authorize the administration of contrast media per Radiology protocol    Answer:  Yes    Order Specific Question:  Reason for Exam (SYMPTOM  OR DIAGNOSIS REQUIRED)    Answer:  abcess parotid gland lymphadnopathy of the right cervical region    Order Specific Question:  Preferred imaging location?    Answer:  [id number]    Order Specific Question:  Call Report- Best Contact Number?    Answer:  [phone]:  [phone]  . Comprehensive metabolic panel  .  VITAMIN D 25 Hydroxy (Vit-D Deficiency, Fractures)  . POCT CBC     I personally performed the services described in this documentation, which was scribed in my presence. The recorded information has been reviewed and considered, and addended by me as needed.  Norberto SorensonEva Shaw, MD MPH  By signing my name below, I, Littie Deedsichard Sun, attest that this documentation has been prepared under the direction and in the presence of Norberto SorensonEva Shaw, MD.  Electronically Signed: Littie Deedsichard Sun, Medical Scribe. 01/21/2015. 2:41 PM.

## 2015-01-22 LAB — VITAMIN D 25 HYDROXY (VIT D DEFICIENCY, FRACTURES): Vit D, 25-Hydroxy: 14 ng/mL — ABNORMAL LOW (ref 30–100)

## 2015-01-23 ENCOUNTER — Ambulatory Visit: Payer: BLUE CROSS/BLUE SHIELD | Admitting: Family Medicine

## 2015-01-27 MED ORDER — VITAMIN D (ERGOCALCIFEROL) 1.25 MG (50000 UNIT) PO CAPS
50000.0000 [IU] | ORAL_CAPSULE | ORAL | Status: DC
Start: 2015-01-27 — End: 2015-04-15

## 2015-01-27 NOTE — Addendum Note (Signed)
Addended by: Norberto SorensonSHAW, Harden Bramer on: 01/27/2015 10:04 AM   Modules accepted: Orders

## 2015-04-08 ENCOUNTER — Other Ambulatory Visit: Payer: Self-pay | Admitting: General Surgery

## 2015-04-09 DIAGNOSIS — L0592 Pilonidal sinus without abscess: Secondary | ICD-10-CM

## 2015-04-09 HISTORY — DX: Pilonidal sinus without abscess: L05.92

## 2015-04-10 ENCOUNTER — Ambulatory Visit: Payer: BLUE CROSS/BLUE SHIELD | Admitting: Family Medicine

## 2015-04-15 ENCOUNTER — Encounter (HOSPITAL_BASED_OUTPATIENT_CLINIC_OR_DEPARTMENT_OTHER): Payer: Self-pay | Admitting: *Deleted

## 2015-04-17 NOTE — H&P (Signed)
Larry Hayden 04/08/2015 2:22 PM Location: [place] [facility] Patient #: [id number] DOB: Jan 15, 1986 Undefined / Language: Arabic / Race: Undefined Male   History of Present Illness Larry Lint MD; 04/08/2015 2:57 PM) Patient words: pilonidal cyst.  The patient is a 30 year old male who presents with a pilonidal cyst. Pt is a 30 yo M referred for consultation by Jacob Moores, PA-C for chronic pilonidal disease. He started having issues around 2 years ago. He has had an area in his buttock crease that has gotten infected many times. He has had to have it drained in the ED previously, but it has also opened up spontaneously many times. The last time was around 2 weeks ago. Currently he is pain free. He denies fever/chills. He has not had surgical excision of this before. He has had to miss work many times for this problem. His sister and father have also had pilonidal disease.   Allergies (Ammie Eversole, LPN; 1/32/4401 0:27 PM) No Known Drug Allergies01/31/2017  Medication History (Ammie Eversole, LPN; 2/53/6644 0:34 PM) Drisdol (50000UNIT Capsule, Oral) Active. Myorisan (  Capsule, Oral) Active. Medications Reconciled  Social History Larry Lint, MD; 04/08/2015 3:01 PM) Tobacco use Current every day smoker. Caffeine use Tea.  Family History Larry Lint, MD; 04/08/2015 3:00 PM) Breast cancer Mother. Colon Cancer Mother. Hypertension Mother. Thyroid problems Sister.    Review of Systems Larry Lint MD; 04/08/2015 2:58 PM) Skin Note: acne treated with isoretinoin   All other systems negative  Vitals (Ammie Eversole LPN; 7/42/5956 3:87 PM) 04/08/2015 2:23 PM Weight: 158 lb Height: 68in Body Surface Area: 1.85 m Body Mass Index: 24.02 kg/m  Temp.: 97.36F(Oral)  Pulse: 82 (Regular)  BP: 136/88 (Sitting, Left Arm, Standard)       Physical Exam Larry Lint MD; 04/08/2015 3:03 PM) General Mental Status-Alert. General  Appearance-Consistent with stated age. Hydration-Well hydrated. Voice-Normal.  Integumentary Note: pilonidal sinus just to left of gluteal crease. no erythema, non tender. firm tract heading toward midline.   Chest and Lung Exam Chest and lung exam reveals -quiet, even and easy respiratory effort with no use of accessory muscles. Inspection Chest Wall - Normal. Back - normal.  Cardiovascular Cardiovascular examination reveals -normal pedal pulses bilaterally. Note: regular rate and rhythm  Neurologic Neurologic evaluation reveals -alert and oriented x 3 with no impairment of recent or remote memory. Mental Status-Normal.  Musculoskeletal Global Assessment -Note: no gross deformities.  Normal Exam - Left-Upper Extremity Strength Normal and Lower Extremity Strength Normal. Normal Exam - Right-Upper Extremity Strength Normal and Lower Extremity Strength Normal.    Assessment & Plan Larry Lint MD; 04/08/2015 2:53 PM) CHRONIC RECURRENT PILONIDAL CYST WITHOUT ABSCESS (L05.91) Impression: Pt has chronic pilonidal sinus with recurrent infection. No current infection.  Given number of times he has had issues with infection and drainage, it is very reasonable to excise this chronic sinus. I reviewed the risks of surgery including wound breakdown, possible need for dressing changes, bleeding, and possible development of new pilonidal disease above or below the wound. I reviewed that I would be cutting out an ellipse of skin and soft tissue going all the way down to the muscle near the bone. He was given Agricultural engineer.  orders written in epic and in office. Current Plans Pt Education - [facility] Pilonidal Disease (AT) Pt Education - [facility] Pilonidal Surgery HCI - post op instructions: discussed with patient and provided information. You are being scheduled for surgery - Our schedulers will call you.  You should hear from our  office's scheduling department within  5 working days about the location, date, and time of surgery. We try to make accommodations for patient's preferences in scheduling surgery, but sometimes the OR schedule or the surgeon's schedule prevents [place] from making those accommodations.  If you have not heard from our office [phone]) in 5 working days, call the office and ask for your surgeon's nurse.  If you have other questions about your diagnosis, plan, or surgery, call the office and ask for your surgeon's nurse.    Signed by Larry Lint, MD (04/08/2015 3:04 PM)

## 2015-04-18 NOTE — Pre-Procedure Instructions (Addendum)
Chanetta Marshall will be interpreter for pt., per Darel Hong at [facility]; please call [phone] if surgery time changes.

## 2015-04-23 ENCOUNTER — Encounter (HOSPITAL_BASED_OUTPATIENT_CLINIC_OR_DEPARTMENT_OTHER): Payer: Self-pay | Admitting: Anesthesiology

## 2015-04-23 ENCOUNTER — Ambulatory Visit (HOSPITAL_BASED_OUTPATIENT_CLINIC_OR_DEPARTMENT_OTHER): Payer: BLUE CROSS/BLUE SHIELD | Admitting: Anesthesiology

## 2015-04-23 ENCOUNTER — Encounter (HOSPITAL_BASED_OUTPATIENT_CLINIC_OR_DEPARTMENT_OTHER)
Admission: RE | Disposition: A | Discharge status: Home / Self Care | Payer: Self-pay | Source: Ambulatory Visit | Attending: General Surgery

## 2015-04-23 ENCOUNTER — Ambulatory Visit (HOSPITAL_BASED_OUTPATIENT_CLINIC_OR_DEPARTMENT_OTHER)
Admission: RE | Admit: 2015-04-23 | Discharge: 2015-04-23 | Disposition: A | Discharge status: Home / Self Care | Payer: BLUE CROSS/BLUE SHIELD | Source: Ambulatory Visit | Attending: General Surgery | Admitting: General Surgery

## 2015-04-23 DIAGNOSIS — F17200 Nicotine dependence, unspecified, uncomplicated: Secondary | ICD-10-CM | POA: Diagnosis not present

## 2015-04-23 DIAGNOSIS — L0591 Pilonidal cyst without abscess: Secondary | ICD-10-CM | POA: Insufficient documentation

## 2015-04-23 HISTORY — PX: PILONIDAL CYST EXCISION: SHX744

## 2015-04-23 HISTORY — DX: Vitamin D deficiency, unspecified: E55.9

## 2015-04-23 HISTORY — DX: Acne, unspecified: L70.9

## 2015-04-23 HISTORY — DX: Pilonidal sinus without abscess: L05.92

## 2015-04-23 HISTORY — DX: Personal history of other endocrine, nutritional and metabolic disease: Z86.39

## 2015-04-23 HISTORY — DX: Other diseases of pharynx: J39.2

## 2015-04-23 HISTORY — DX: Unspecified injury of face, initial encounter: S09.93XA

## 2015-04-23 SURGERY — EXCISION, PILONIDAL CYST, EXTENSIVE
Anesthesia: General | Site: Buttocks

## 2015-04-23 MED ORDER — GLYCOPYRROLATE 0.2 MG/ML IJ SOLN: 0.2000 mg | Freq: Once | INTRAMUSCULAR | Status: DC | PRN

## 2015-04-23 MED ORDER — GLYCOPYRROLATE 0.2 MG/ML IJ SOLN
INTRAMUSCULAR | Status: AC
  Filled 2015-04-23: qty 1

## 2015-04-23 MED ORDER — CEFAZOLIN SODIUM-DEXTROSE 2-3 GM-% IV SOLR
INTRAVENOUS | Status: AC
  Filled 2015-04-23: qty 50

## 2015-04-23 MED ORDER — PROPOFOL 10 MG/ML IV BOLUS
INTRAVENOUS | Status: AC
  Filled 2015-04-23: qty 40

## 2015-04-23 MED ORDER — PHENYLEPHRINE HCL 10 MG/ML IJ SOLN
INTRAMUSCULAR | Status: AC
  Filled 2015-04-23: qty 1

## 2015-04-23 MED ORDER — ONDANSETRON HCL 4 MG/2ML IJ SOLN
INTRAMUSCULAR | Status: AC
  Filled 2015-04-23: qty 2

## 2015-04-23 MED ORDER — DEXAMETHASONE SODIUM PHOSPHATE 4 MG/ML IJ SOLN
INTRAMUSCULAR | Status: DC | PRN
  Administered 2015-04-23: 10 mg via INTRAVENOUS

## 2015-04-23 MED ORDER — PROMETHAZINE HCL 25 MG/ML IJ SOLN: 6.2500 mg | INTRAMUSCULAR | Status: DC | PRN

## 2015-04-23 MED ORDER — EPHEDRINE SULFATE 50 MG/ML IJ SOLN
INTRAMUSCULAR | Status: AC
  Filled 2015-04-23: qty 1

## 2015-04-23 MED ORDER — MIDAZOLAM HCL 2 MG/2ML IJ SOLN
INTRAMUSCULAR | Status: AC
  Filled 2015-04-23: qty 2

## 2015-04-23 MED ORDER — FENTANYL CITRATE (PF) 100 MCG/2ML IJ SOLN
50.0000 ug | INTRAMUSCULAR | Status: DC | PRN
  Administered 2015-04-23: 100 ug via INTRAVENOUS

## 2015-04-23 MED ORDER — BUPIVACAINE LIPOSOME 1.3 % IJ SUSP
INTRAMUSCULAR | Status: AC
  Filled 2015-04-23: qty 20

## 2015-04-23 MED ORDER — HYDROMORPHONE HCL 1 MG/ML IJ SOLN
0.2500 mg | INTRAMUSCULAR | Status: DC | PRN
  Administered 2015-04-23: 0.5 mg via INTRAVENOUS

## 2015-04-23 MED ORDER — BUPIVACAINE LIPOSOME 1.3 % IJ SUSP
INTRAMUSCULAR | Status: DC | PRN
  Administered 2015-04-23: 20 mL

## 2015-04-23 MED ORDER — FENTANYL CITRATE (PF) 100 MCG/2ML IJ SOLN
INTRAMUSCULAR | Status: AC
  Filled 2015-04-23: qty 2

## 2015-04-23 MED ORDER — ATROPINE SULFATE 0.4 MG/ML IJ SOLN
INTRAMUSCULAR | Status: AC
  Filled 2015-04-23: qty 1

## 2015-04-23 MED ORDER — MIDAZOLAM HCL 2 MG/2ML IJ SOLN
1.0000 mg | INTRAMUSCULAR | Status: DC | PRN
  Administered 2015-04-23: 2 mg via INTRAVENOUS

## 2015-04-23 MED ORDER — CEFAZOLIN SODIUM-DEXTROSE 2-3 GM-% IV SOLR
2.0000 g | INTRAVENOUS | Status: AC
  Administered 2015-04-23: 2 g via INTRAVENOUS

## 2015-04-23 MED ORDER — OXYCODONE HCL 5 MG PO TABS
ORAL_TABLET | ORAL | Status: AC
  Filled 2015-04-23: qty 1

## 2015-04-23 MED ORDER — DEXAMETHASONE SODIUM PHOSPHATE 10 MG/ML IJ SOLN
INTRAMUSCULAR | Status: AC
  Filled 2015-04-23: qty 1

## 2015-04-23 MED ORDER — PROPOFOL 10 MG/ML IV BOLUS
INTRAVENOUS | Status: DC | PRN
  Administered 2015-04-23: 200 mg via INTRAVENOUS

## 2015-04-23 MED ORDER — SUCCINYLCHOLINE CHLORIDE 20 MG/ML IJ SOLN
INTRAMUSCULAR | Status: AC
  Filled 2015-04-23: qty 1

## 2015-04-23 MED ORDER — ONDANSETRON HCL 4 MG/2ML IJ SOLN
INTRAMUSCULAR | Status: DC | PRN
  Administered 2015-04-23: 4 mg via INTRAVENOUS

## 2015-04-23 MED ORDER — SUCCINYLCHOLINE CHLORIDE 20 MG/ML IJ SOLN
INTRAMUSCULAR | Status: DC | PRN
  Administered 2015-04-23: 160 mg via INTRAVENOUS

## 2015-04-23 MED ORDER — LACTATED RINGERS IV SOLN
INTRAVENOUS | Status: DC
  Administered 2015-04-23 (×2): via INTRAVENOUS

## 2015-04-23 MED ORDER — OXYCODONE HCL 5 MG PO TABS
5.0000 mg | ORAL_TABLET | Freq: Once | ORAL | Status: AC
Start: 2015-04-23 — End: 2015-04-23
  Administered 2015-04-23: 5 mg via ORAL

## 2015-04-23 MED ORDER — LIDOCAINE HCL (CARDIAC) 20 MG/ML IV SOLN
INTRAVENOUS | Status: AC
  Filled 2015-04-23: qty 5

## 2015-04-23 MED ORDER — HYDROMORPHONE HCL 1 MG/ML IJ SOLN
INTRAMUSCULAR | Status: AC
  Filled 2015-04-23: qty 1

## 2015-04-23 MED ORDER — SCOPOLAMINE 1 MG/3DAYS TD PT72: 1.0000 | MEDICATED_PATCH | Freq: Once | TRANSDERMAL | Status: DC | PRN

## 2015-04-23 MED ORDER — OXYCODONE-ACETAMINOPHEN 5-325 MG PO TABS: 1.0000 | ORAL_TABLET | ORAL | Status: DC | PRN

## 2015-04-23 MED ORDER — EPHEDRINE SULFATE 50 MG/ML IJ SOLN
INTRAMUSCULAR | Status: DC | PRN
  Administered 2015-04-23: 5 mg via INTRAVENOUS

## 2015-04-23 MED ORDER — LIDOCAINE HCL (CARDIAC) 20 MG/ML IV SOLN
INTRAVENOUS | Status: DC | PRN
  Administered 2015-04-23: 50 mg via INTRAVENOUS

## 2015-04-23 SURGICAL SUPPLY — 47 items
BLADE HEX COATED 2.75 (ELECTRODE) ×2 IMPLANT
BLADE SURG 10 STRL SS (BLADE) ×2 IMPLANT
BLADE SURG 15 STRL LF DISP TIS (BLADE) ×1 IMPLANT
BLADE SURG 15 STRL SS (BLADE) ×1
BRIEF STRETCH FOR OB PAD LRG (UNDERPADS AND DIAPERS) ×2 IMPLANT
CANISTER SUCT 1200ML W/VALVE (MISCELLANEOUS) ×2 IMPLANT
COVER BACK TABLE 60X90IN (DRAPES) ×2 IMPLANT
COVER MAYO STAND STRL (DRAPES) ×2 IMPLANT
DERMABOND ADVANCED (GAUZE/BANDAGES/DRESSINGS) ×1
DERMABOND ADVANCED .7 DNX12 (GAUZE/BANDAGES/DRESSINGS) ×1 IMPLANT
DRAPE LAPAROTOMY 100X72 PEDS (DRAPES) ×2 IMPLANT
DRAPE UTILITY XL STRL (DRAPES) ×2 IMPLANT
DRSG PAD ABDOMINAL 8X10 ST (GAUZE/BANDAGES/DRESSINGS) ×2 IMPLANT
ELECT REM PT RETURN 9FT ADLT (ELECTROSURGICAL) ×2
ELECTRODE REM PT RTRN 9FT ADLT (ELECTROSURGICAL) ×1 IMPLANT
GLOVE BIO SURGEON STRL SZ 6 (GLOVE) ×2 IMPLANT
GLOVE BIOGEL PI IND STRL 6.5 (GLOVE) ×1 IMPLANT
GLOVE BIOGEL PI IND STRL 7.0 (GLOVE) ×1 IMPLANT
GLOVE BIOGEL PI INDICATOR 6.5 (GLOVE) ×1
GLOVE BIOGEL PI INDICATOR 7.0 (GLOVE) ×1
GLOVE ECLIPSE 6.5 STRL STRAW (GLOVE) ×2 IMPLANT
GOWN STRL REUS W/ TWL LRG LVL3 (GOWN DISPOSABLE) ×1 IMPLANT
GOWN STRL REUS W/TWL 2XL LVL3 (GOWN DISPOSABLE) ×2 IMPLANT
GOWN STRL REUS W/TWL LRG LVL3 (GOWN DISPOSABLE) ×1
MICROMATRIX 500MG (Tissue) ×2 IMPLANT
NEEDLE HYPO 25X1 1.5 SAFETY (NEEDLE) ×2 IMPLANT
NS IRRIG 1000ML POUR BTL (IV SOLUTION) ×2 IMPLANT
PACK BASIN DAY SURGERY FS (CUSTOM PROCEDURE TRAY) ×2 IMPLANT
PENCIL BUTTON HOLSTER BLD 10FT (ELECTRODE) ×2 IMPLANT
SLEEVE SCD COMPRESS KNEE MED (MISCELLANEOUS) ×2 IMPLANT
SOLUTION PARTIC MCRMTRX 500MG (Tissue) ×1 IMPLANT
SPONGE GAUZE 4X4 12PLY STER LF (GAUZE/BANDAGES/DRESSINGS) ×2 IMPLANT
SPONGE LAP 18X18 X RAY DECT (DISPOSABLE) ×2 IMPLANT
SPONGE SURGIFOAM ABS GEL 100 (HEMOSTASIS) IMPLANT
STRIP CLOSURE SKIN 1/2X4 (GAUZE/BANDAGES/DRESSINGS) ×2 IMPLANT
SURGILUBE 2OZ TUBE FLIPTOP (MISCELLANEOUS) IMPLANT
SUT CHROMIC 3 0 SH 27 (SUTURE) IMPLANT
SUT VIC AB 2-0 SH 27 (SUTURE) ×4
SUT VIC AB 2-0 SH 27XBRD (SUTURE) ×4 IMPLANT
SUT VIC AB 3-0 SH 27 (SUTURE) ×2
SUT VIC AB 3-0 SH 27X BRD (SUTURE) ×2 IMPLANT
SUT VICRYL 4-0 PS2 18IN ABS (SUTURE) ×2 IMPLANT
SYR CONTROL 10ML LL (SYRINGE) ×2 IMPLANT
TOWEL OR 17X24 6PK STRL BLUE (TOWEL DISPOSABLE) ×2 IMPLANT
TRAY DSU PREP LF (CUSTOM PROCEDURE TRAY) ×2 IMPLANT
TUBE CONNECTING 20X1/4 (TUBING) ×2 IMPLANT
YANKAUER SUCT BULB TIP NO VENT (SUCTIONS) ×2 IMPLANT

## 2015-04-23 NOTE — Discharge Instructions (Addendum)
[facility] Office Phone Number [phone]   POST OP INSTRUCTIONS  Always review your discharge instruction sheet given to you by the facility where your surgery was performed.  IF YOU HAVE DISABILITY OR FAMILY LEAVE FORMS, YOU MUST BRING THEM TO THE OFFICE FOR PROCESSING.  DO NOT GIVE THEM TO YOUR DOCTOR.  1. A prescription for pain medication may be given to you upon discharge.  Take your pain medication as prescribed, if needed.  If narcotic pain medicine is not needed, then you may take acetaminophen (Tylenol) or ibuprofen (Advil) as needed. 2. Take your usually prescribed medications unless otherwise directed 3. If you need a refill on your pain medication, please contact your pharmacy.  They will contact our office to request authorization.  Prescriptions will not be filled after 5pm or on week-ends. 4. You should eat very light the first 24 hours after surgery, such as soup, crackers, pudding, etc.  Resume your normal diet the day after surgery 5. It is common to experience some constipation if taking pain medication after surgery.  Increasing fluid intake and taking a stool softener (Colace or store brand is OK) will usually help or prevent this problem from occurring.  A mild laxative (Milk of Magnesia or Miralax) should be taken according to package directions if there are no bowel movements after 24 hours. 6. You may shower in 48 hours.  The surgical glue will flake off in 2-3 weeks.   7. ACTIVITIES:  No strenuous activity or heavy lifting for 1 week.   a. You may drive when you no longer are taking prescription pain medication, you can comfortably wear a seatbelt, and you can safely maneuver your car and apply brakes. b. RETURN TO WORK:  _________1-4 weeks depending on post op recovery.____________ Larry Hayden should see your doctor in the office for a follow-up appointment approximately three-four weeks after your surgery.    WHEN TO CALL YOUR DOCTOR: 1. Fever over  101.0 2. Nausea and/or vomiting. 3. Extreme swelling or bruising. 4. Continued bleeding from incision. 5. Increased pain, redness, or drainage from the incision.  The clinic staff is available to answer your questions during regular business hours.  Please dont hesitate to call and ask to speak to one of the nurses for clinical concerns.  If you have a medical emergency, go to the nearest emergency room or call 911.  A surgeon from [facility] is always on call at the hospital.  For further questions, please visit [email or web address]    Post Anesthesia Home Care Instructions  Activity: Get plenty of rest for the remainder of the day. A responsible adult should stay with you for 24 hours following the procedure.  For the next 24 hours, DO NOT: -Drive a car -Advertising copywriter -Drink alcoholic beverages -Take any medication unless instructed by your physician -Make any legal decisions or sign important papers.  Meals: Start with liquid foods such as gelatin or soup. Progress to regular foods as tolerated. Avoid greasy, spicy, heavy foods. If nausea and/or vomiting occur, drink only clear liquids until the nausea and/or vomiting subsides. Call your physician if vomiting continues.  Special Instructions/Symptoms: Your throat may feel dry or sore from the anesthesia or the breathing tube placed in your throat during surgery. If this causes discomfort, gargle with warm salt water. The discomfort should disappear within 24 hours.  If you had a scopolamine patch placed behind your ear for the management of post- operative nausea and/or vomiting:  1. The medication in  the patch is effective for 72 hours, after which it should be removed.  Wrap patch in a tissue and discard in the trash. Wash hands thoroughly with soap and water. 2. You may remove the patch earlier than 72 hours if you experience unpleasant side effects which may include dry mouth, dizziness or visual  disturbances. 3. Avoid touching the patch. Wash your hands with soap and water after contact with the patch.

## 2015-04-23 NOTE — Anesthesia Procedure Notes (Signed)
Procedure Name: Intubation Date/Time: 04/23/2015 2:05 PM Performed by: Zenia Resides D Pre-anesthesia Checklist: Patient identified, Emergency Drugs available, Suction available and Patient being monitored Patient Re-evaluated:Patient Re-evaluated prior to inductionOxygen Delivery Method: Circle System Utilized Preoxygenation: Pre-oxygenation with 100% oxygen Intubation Type: IV induction Ventilation: Mask ventilation without difficulty Laryngoscope Size: Mac and 3 Grade View: Grade I Tube type: Oral Tube size: 8.0 mm Number of attempts: 1 Airway Equipment and Method: Stylet and Oral airway Placement Confirmation: ETT inserted through vocal cords under direct vision,  positive ETCO2 and breath sounds checked- equal and bilateral Secured at: 23 cm Tube secured with: Tape Dental Injury: Teeth and Oropharynx as per pre-operative assessment

## 2015-04-23 NOTE — Anesthesia Preprocedure Evaluation (Addendum)
Anesthesia Evaluation  Patient identified by MRN, date of birth, ID band Patient awake    Reviewed: Allergy & Precautions, H&P , NPO status , Patient's Chart, lab work & pertinent test results  Airway Mallampati: III  TM Distance: >3 FB Neck ROM: full    Dental  (+) Teeth Intact, Chipped, Dental Advidsory Given   Pulmonary Current Smoker,    breath sounds clear to auscultation       Cardiovascular negative cardio ROS   Rhythm:regular Rate:Normal     Neuro/Psych negative neurological ROS  negative psych ROS   GI/Hepatic Neg liver ROS,   Endo/Other  Morbid obesity  Renal/GU negative Renal ROS     Musculoskeletal   Abdominal   Peds  Hematology   Anesthesia Other Findings   Reproductive/Obstetrics negative OB ROS                            Anesthesia Physical Anesthesia Plan  ASA: II  Anesthesia Plan: General LMA   Post-op Pain Management:    Induction:   Airway Management Planned:   Additional Equipment:   Intra-op Plan:   Post-operative Plan:   Informed Consent: I have reviewed the patients History and Physical, chart, labs and discussed the procedure including the risks, benefits and alternatives for the proposed anesthesia with the patient or authorized representative who has indicated his/her understanding and acceptance.   Dental Advisory Given  Plan Discussed with: Anesthesiologist, CRNA and Surgeon  Anesthesia Plan Comments: (Interpreter was used for the interview)       Anesthesia Quick Evaluation

## 2015-04-23 NOTE — Op Note (Signed)
PRE-OPERATIVE DIAGNOSIS: recurrent pilonidal sinus with prior infection  POST-OPERATIVE DIAGNOSIS:  Same  PROCEDURE:  Procedure(s): Excision of pilonidal sinus/cyst   SURGEON:  Surgeon(s): Almond Lint, MD  ANESTHESIA:   local and general  DRAINS: none   LOCAL MEDICATIONS USED:  BUPIVICAINE (exparel)  SPECIMEN:  Source of Specimen:  pilonidal  DISPOSITION OF SPECIMEN:  PATHOLOGY  COUNTS:  YES  DICTATION: .Dragon Dictation  PLAN OF CARE: Discharge to home after PACU  PATIENT DISPOSITION:  PACU - hemodynamically stable.  FINDINGS:  Chronic pilonidal sinus  EBL: min  PROCEDURE:   Patient was identified in the holding area and taken to the operating room where he was intubated on the stretcher. He was then placed into the prone jackknife position. His gluteal cleft was prepped and draped in standard fashion. A timeout was performed according to the surgical safety checklist. When all was correct, we continued.   An elliptical incision was made around the pilonidal cyst. The dissection was performed with the cautery all the way down to the fascia overlying the sacrum. Care was taken not to get into the sinus tract. The wound was copiously irrigated. Hemostasis was achieved with cautery. Local anesthetic was administered around the wound.   ACell powder was placed in the cavity to facilitate healing.  The wound was then closed with 4 layers of interrupted 2-0 Vicryl. The skin was reapproximated with 3-0 Vicryl deep dermal suture and 4-0 Vicryl running subcuticular suture. The incision was then cleaned, dried, and dressed with Dermabond. Patient was then placed into mesh underwear. The patient was then turned back supine onto the stretcher and extubated.   He was taken to the PACU in stable condition. Needle, sponge, and instrument counts were correct 2.

## 2015-04-23 NOTE — Interval H&P Note (Signed)
History and Physical Interval Note:  04/23/2015 1:32 PM  Larry Hayden  has presented today for surgery, with the diagnosis of PILONIDAL SINUS  The various methods of treatment have been discussed with the patient and family. After consideration of risks, benefits and other options for treatment, the patient has consented to  Procedure(s): CYST EXCISION PILONIDAL EXTENSIVE (N/A) as a surgical intervention .  The patient's history has been reviewed, patient examined, no change in status, stable for surgery.  I have reviewed the patient's chart and labs.  Questions were answered to the patient's satisfaction.     Yuto Cajuste

## 2015-04-23 NOTE — Transfer of Care (Signed)
Immediate Anesthesia Transfer of Care Note  Patient: Larry Hayden  Procedure(s) Performed: Procedure(s): CYST EXCISION PILONIDAL EXTENSIVE (N/A)  Patient Location: PACU  Anesthesia Type:General  Level of Consciousness: awake, alert  and oriented  Airway & Oxygen Therapy: Patient Spontanous Breathing and Patient connected to face mask oxygen  Post-op Assessment: Report given to RN and Post -op Vital signs reviewed and stable  Post vital signs: Reviewed and stable  Last Vitals:  Filed Vitals:   04/23/15 1221  BP: 140/87  Pulse: 73  Temp: 36.8 C  Resp: 18    Complications: No apparent anesthesia complications

## 2015-04-23 NOTE — Anesthesia Postprocedure Evaluation (Signed)
Anesthesia Post Note  Patient: Larry Hayden  Procedure(s) Performed: Procedure(s) (LRB): CYST EXCISION PILONIDAL EXTENSIVE (N/A)  Patient location during evaluation: PACU Anesthesia Type: General Level of consciousness: sedated Pain management: pain level controlled Vital Signs Assessment: post-procedure vital signs reviewed and stable Respiratory status: spontaneous breathing and respiratory function stable Cardiovascular status: stable Anesthetic complications: no    Last Vitals:  Filed Vitals:   04/23/15 1515 04/23/15 1530  BP: 125/70 129/75  Pulse: 95 93  Temp:    Resp: 16 16    Last Pain:  Filed Vitals:   04/23/15 1534  PainSc: 0-No pain                 Dena Esperanza DANIEL

## 2015-04-24 ENCOUNTER — Encounter (HOSPITAL_BASED_OUTPATIENT_CLINIC_OR_DEPARTMENT_OTHER): Payer: Self-pay | Admitting: General Surgery

## 2015-04-24 NOTE — Addendum Note (Signed)
Addendum  created 04/24/15 1610 by Jewel Baize Irwin Toran, CRNA   Modules edited: Charges VN

## 2015-05-20 ENCOUNTER — Telehealth: Payer: Self-pay | Admitting: Family Medicine

## 2015-05-20 NOTE — Telephone Encounter (Signed)
Left a message to see if they have received there flu shot if not, they need to come in and get it 

## 2015-06-30 DIAGNOSIS — L738 Other specified follicular disorders: Secondary | ICD-10-CM | POA: Diagnosis not present

## 2015-06-30 DIAGNOSIS — L700 Acne vulgaris: Secondary | ICD-10-CM | POA: Diagnosis not present

## 2015-06-30 DIAGNOSIS — L648 Other androgenic alopecia: Secondary | ICD-10-CM | POA: Diagnosis not present

## 2015-08-05 ENCOUNTER — Ambulatory Visit (INDEPENDENT_AMBULATORY_CARE_PROVIDER_SITE_OTHER): Payer: BLUE CROSS/BLUE SHIELD | Admitting: Family Medicine

## 2015-08-05 VITALS — BP 124/78 | HR 73 | Temp 97.4°F | Resp 16 | Ht 68.0 in | Wt 250.6 lb

## 2015-08-05 DIAGNOSIS — E860 Dehydration: Secondary | ICD-10-CM

## 2015-08-05 DIAGNOSIS — R3129 Other microscopic hematuria: Secondary | ICD-10-CM

## 2015-08-05 DIAGNOSIS — J069 Acute upper respiratory infection, unspecified: Secondary | ICD-10-CM

## 2015-08-05 DIAGNOSIS — K529 Noninfective gastroenteritis and colitis, unspecified: Secondary | ICD-10-CM | POA: Diagnosis not present

## 2015-08-05 DIAGNOSIS — R197 Diarrhea, unspecified: Secondary | ICD-10-CM | POA: Diagnosis not present

## 2015-08-05 DIAGNOSIS — J302 Other seasonal allergic rhinitis: Secondary | ICD-10-CM | POA: Diagnosis not present

## 2015-08-05 DIAGNOSIS — R1032 Left lower quadrant pain: Secondary | ICD-10-CM | POA: Diagnosis not present

## 2015-08-05 LAB — POCT URINALYSIS DIP (MANUAL ENTRY)
Bilirubin, UA: NEGATIVE
Glucose, UA: NEGATIVE
Ketones, POC UA: NEGATIVE
LEUKOCYTES UA: NEGATIVE
Nitrite, UA: NEGATIVE
PROTEIN UA: NEGATIVE
SPEC GRAV UA: 1.025
UROBILINOGEN UA: 0.2
pH, UA: 5.5

## 2015-08-05 LAB — COMPREHENSIVE METABOLIC PANEL
ALBUMIN: 4.3 g/dL (ref 3.6–5.1)
ALK PHOS: 90 U/L (ref 40–115)
ALT: 38 U/L (ref 9–46)
AST: 19 U/L (ref 10–40)
BUN: 13 mg/dL (ref 7–25)
CALCIUM: 10.2 mg/dL (ref 8.6–10.3)
CO2: 25 mmol/L (ref 20–31)
Chloride: 103 mmol/L (ref 98–110)
Creat: 0.74 mg/dL (ref 0.60–1.35)
GLUCOSE: 94 mg/dL (ref 65–99)
POTASSIUM: 4 mmol/L (ref 3.5–5.3)
Sodium: 137 mmol/L (ref 135–146)
TOTAL PROTEIN: 6.7 g/dL (ref 6.1–8.1)
Total Bilirubin: 0.4 mg/dL (ref 0.2–1.2)

## 2015-08-05 LAB — POCT CBC
Granulocyte percent: 72.7 %G (ref 37–80)
HEMATOCRIT: 43.4 % — AB (ref 43.5–53.7)
HEMOGLOBIN: 15.4 g/dL (ref 14.1–18.1)
LYMPH, POC: 1.6 (ref 0.6–3.4)
MCH: 28.8 pg (ref 27–31.2)
MCHC: 35.4 g/dL (ref 31.8–35.4)
MCV: 81.3 fL (ref 80–97)
MID (cbc): 0.3 (ref 0–0.9)
MPV: 8.8 fL (ref 0–99.8)
POC GRANULOCYTE: 5.1 (ref 2–6.9)
POC LYMPH PERCENT: 22.8 %L (ref 10–50)
POC MID %: 4.5 % (ref 0–12)
Platelet Count, POC: 171 10*3/uL (ref 142–424)
RBC: 5.33 M/uL (ref 4.69–6.13)
RDW, POC: 12.9 %
WBC: 7 10*3/uL (ref 4.6–10.2)

## 2015-08-05 LAB — POC MICROSCOPIC URINALYSIS (UMFC)

## 2015-08-05 LAB — LIPASE: LIPASE: 15 U/L (ref 7–60)

## 2015-08-05 MED ORDER — PROMETHAZINE-PHENYLEPHRINE 6.25-5 MG/5ML PO SYRP: 5.0000 mL | ORAL_SOLUTION | ORAL | Status: DC | PRN

## 2015-08-05 MED ORDER — LOPERAMIDE HCL 2 MG PO CAPS: 2.0000 mg | ORAL_CAPSULE | ORAL | Status: DC | PRN

## 2015-08-05 NOTE — Patient Instructions (Addendum)
IF you received an x-ray today, you will receive an invoice from [facility]. Please contact [facility] at [phone] with questions or concerns regarding your invoice.   IF you received labwork today, you will receive an invoice from [employer]. Please contact [facility] at [phone] with questions or concerns regarding your invoice.   Our billing staff will not be able to assist you with questions regarding bills from these companies.  You will be contacted with the lab results as soon as they are available. The fastest way to get your results is to activate your My Chart account. Instructions are located on the last page of this paperwork. If you have not heard from [place] regarding the results in 2 weeks, please contact this office.    Viral Gastroenteritis Viral gastroenteritis is also called stomach flu. This illness is caused by a certain type of germ (virus). It can cause sudden watery poop (diarrhea) and throwing up (vomiting). This can cause you to lose body fluids (dehydration). This illness usually lasts for 3 to 8 days. It usually goes away on its own. HOME CARE   Drink enough fluids to keep your pee (urine) clear or pale yellow. Drink small amounts of fluids often.  Ask your doctor how to replace body fluid losses (rehydration).  Avoid:  Foods high in sugar.  Alcohol.  Bubbly (carbonated) drinks.  Tobacco.  Juice.  Caffeine drinks.  Very hot or cold fluids.  Fatty, greasy foods.  Eating too much at one time.  Dairy products until 24 to 48 hours after your watery poop stops.  You may eat foods with active cultures (probiotics). They can be found in some yogurts and supplements.  Wash your hands well to avoid spreading the illness.  Only take medicines as told by your doctor. Do not give aspirin to children. Do not take medicines for watery poop (antidiarrheals).  Ask your doctor if you should keep taking  your regular medicines.  Keep all doctor visits as told. GET HELP RIGHT AWAY IF:   You cannot keep fluids down.  You do not pee at least once every 6 to 8 hours.  You are short of breath.  You see blood in your poop or throw up. This may look like coffee grounds.  You have belly (abdominal) pain that gets worse or is just in one small spot (localized).  You keep throwing up or having watery poop.  You have a fever.  The patient is a child younger than 3 months, and he or she has a fever.  The patient is a child older than 3 months, and he or she has a fever and problems that do not go away.  The patient is a child older than 3 months, and he or she has a fever and problems that suddenly get worse.  The patient is a baby, and he or she has no tears when crying. MAKE SURE YOU:   Understand these instructions.  Will watch your condition.  Will get help right away if you are not doing well or get worse.   This information is not intended to replace advice given to you by your health care provider. Make sure you discuss any questions you have with your health care provider.   Document Released: 08/11/2007 Document Revised: 05/17/2011 Document Reviewed: 12/09/2010 Elsevier Interactive Patient Education 2016 [employer].  Food Choices to Help Relieve Diarrhea, Adult When you have diarrhea, the foods you eat and your eating  habits are very important. Choosing the right foods and drinks can help relieve diarrhea. Also, because diarrhea can last up to 7 days, you need to replace lost fluids and electrolytes (such as sodium, potassium, and chloride) in order to help prevent dehydration.  WHAT GENERAL GUIDELINES DO I NEED TO FOLLOW?  Slowly drink 1 cup (8 oz) of fluid for each episode of diarrhea. If you are getting enough fluid, your urine will be clear or pale yellow.  Eat starchy foods. Some good choices include white rice, white toast, pasta, low-fiber cereal, baked potatoes  (without the skin), saltine crackers, and bagels.  Avoid large servings of any cooked vegetables.  Limit fruit to two servings per day. A serving is  cup or 1 small piece.  Choose foods with less than 2 g of fiber per serving.  Limit fats to less than 8 tsp (38 g) per day.  Avoid fried foods.  Eat foods that have probiotics in them. Probiotics can be found in certain dairy products.  Avoid foods and beverages that may increase the speed at which food moves through the stomach and intestines (gastrointestinal tract). Things to avoid include:  High-fiber foods, such as dried fruit, raw fruits and vegetables, nuts, seeds, and whole grain foods.  Spicy foods and high-fat foods.  Foods and beverages sweetened with high-fructose corn syrup, honey, or sugar alcohols such as xylitol, sorbitol, and mannitol. WHAT FOODS ARE RECOMMENDED? Grains White rice. White, [place], or pita breads (fresh or toasted), including plain rolls, buns, or bagels. White pasta. Saltine, soda, or graham crackers. Pretzels. Low-fiber cereal. Cooked cereals made with water (such as cornmeal, farina, or cream cereals). Plain muffins. Matzo. Melba toast. Zwieback.  Vegetables Potatoes (without the skin). Strained tomato and vegetable juices. Most well-cooked and canned vegetables without seeds. Tender lettuce. Fruits Cooked or canned applesauce, apricots, cherries, fruit cocktail, grapefruit, peaches, pears, or plums. Fresh bananas, apples without skin, cherries, grapes, cantaloupe, grapefruit, peaches, oranges, or plums.  Meat and Other Protein Products Baked or boiled chicken. Eggs. Tofu. Fish. Seafood. Smooth peanut butter. Ground or well-cooked tender beef, ham, veal, lamb, pork, or poultry.  Dairy Plain yogurt, kefir, and unsweetened liquid yogurt. Lactose-free milk, buttermilk, or soy milk. Plain hard cheese. Beverages Sport drinks. Clear broths. Diluted fruit juices (except prune). Regular, caffeine-free sodas  such as ginger ale. Water. Decaffeinated teas. Oral rehydration solutions. Sugar-free beverages not sweetened with sugar alcohols. Other Bouillon, broth, or soups made from recommended foods.  The items listed above may not be a complete list of recommended foods or beverages. Contact your dietitian for more options. WHAT FOODS ARE NOT RECOMMENDED? Grains Whole grain, whole wheat, bran, or rye breads, rolls, pastas, crackers, and cereals. Wild or brown rice. Cereals that contain more than 2 g of fiber per serving. Corn tortillas or taco shells. Cooked or dry oatmeal. Granola. Popcorn. Vegetables Raw vegetables. Cabbage, broccoli, Brussels sprouts, artichokes, baked beans, beet greens, corn, kale, legumes, peas, sweet potatoes, and yams. Potato skins. Cooked spinach and cabbage. Fruits Dried fruit, including raisins and dates. Raw fruits. Stewed or dried prunes. Fresh apples with skin, apricots, mangoes, pears, raspberries, and strawberries.  Meat and Other Protein Products Chunky peanut butter. Nuts and seeds. Beans and lentils. Tomasa BlaseBacon.  Dairy High-fat cheeses. Milk, chocolate milk, and beverages made with milk, such as milk shakes. Cream. Ice cream. Sweets and Desserts Sweet rolls, doughnuts, and sweet breads. Pancakes and waffles. Fats and Oils Butter. Cream sauces. Margarine. Salad oils. Plain salad dressings. Olives. Avocados.  Beverages Caffeinated beverages (such as coffee, tea, soda, or energy drinks). Alcoholic beverages. Fruit juices with pulp. Prune juice. Soft drinks sweetened with high-fructose corn syrup or sugar alcohols. Other Coconut. Hot sauce. Chili powder. Mayonnaise. Gravy. Cream-based or milk-based soups.  The items listed above may not be a complete list of foods and beverages to avoid. Contact your dietitian for more information. WHAT SHOULD I DO IF I BECOME DEHYDRATED? Diarrhea can sometimes lead to dehydration. Signs of dehydration include dark urine and dry mouth and  skin. If you think you are dehydrated, you should rehydrate with an oral rehydration solution. These solutions can be purchased at pharmacies, retail stores, or online.  Drink -1 cup (120-240 mL) of oral rehydration solution each time you have an episode of diarrhea. If drinking this amount makes your diarrhea worse, try drinking smaller amounts more often. For example, drink 1-3 tsp (5-15 mL) every 5-10 minutes.  A general rule for staying hydrated is to drink 1-2 L of fluid per day. Talk to your health care provider about the specific amount you should be drinking each day. Drink enough fluids to keep your urine clear or pale yellow.   This information is not intended to replace advice given to you by your health care provider. Make sure you discuss any questions you have with your health care provider.   Document Released: 05/15/2003 Document Revised: 03/15/2014 Document Reviewed: 01/15/2013 Elsevier Interactive Patient Education [employer]

## 2015-08-05 NOTE — Progress Notes (Signed)
By signing my name below, I, Mesha Guinyard, attest that this documentation has been prepared under the direction and in the presence of [name], MD.  Electronically Signed: Arvilla Market, Medical Scribe. 08/05/2015. 12:34 PM.  Subjective:    Patient ID: Larry Hayden, male    DOB: [birth year], 30 y.o.   MRN: [id number]  HPI Chief Complaint  Patient presents with  . Diarrhea    x1day but getting worse  . Headache  . Sore Throat    HPI Comments: Larry Hayden is a 30 y.o. male who presents to the [facility] complaining of abdominal pain onset yesterday. Pt has associated symptoms of diarrhea, increased gas. Pt describes his diarrhea as pure water an expelled 7 small episodes yesterday. Pt took 2 pepto bismal pills with no relief to his symptoms. Pt ate a biscuit and rank [employer] before he arrived. Pt denies having any abdominal surgeries. Pt denies emesis, chest pain, increased belching, bloody stool, mucus in the stool, and changes in his urination. Pt has had problems with constipation in the past, but was having regular bm before his diarrhea onset.  Pt experienced congestion, sore throat, chills, fever and rhinorrhea 3 days ago.  The nasal saline spray for seasonal allergies didn't relieve him of his symptoms. Pt was also on flonase. Pt thinks got sick by sleeping with the window opened and the fan on. Pt has seen ENT for chronic nasal congestion.  Patient Active Problem List   Diagnosis Date Noted  . Hyperparathyroidism (HCC) 12/17/2013  . Vitamin D deficiency 12/17/2013  . Hypercalcemia 06/04/2013  . Stiffness of hand joint 06/04/2013  . Smoking 06/04/2013  . Tobacco use disorder 05/11/2013  . Other and unspecified hyperlipidemia 03/30/2013  . Elevated BP 02/16/2013  . Acne 02/16/2013  . Stuffy nose 02/16/2013  . Cough 02/16/2013   Past Medical History  Diagnosis Date  . Hyperparathyroidism (HCC)   . History of hypercalcemia 2015  . Vitamin D  deficiency   . Acne   . Pilonidal sinus 04/2015  . Hyperactive gag reflex   . Tooth injury     right upper front - hx. of repair   Past Surgical History  Procedure Laterality Date  . No past surgeries    . Pilonidal cyst excision N/A 04/23/2015    Procedure: CYST EXCISION PILONIDAL EXTENSIVE;  Surgeon: Almond Lint, MD;  Location: [facility];  Service: General;  Laterality: N/A;   No Known Allergies Prior to Admission medications   Medication Sig Start Date End Date Taking? Authorizing Provider  cholecalciferol (VITAMIN D) 1000 units tablet Take 1,000 Units by mouth daily.   Yes Historical Provider, MD  ISOtretinoin (MYORISAN) 30 MG capsule Take 30 mg by mouth 2 (two) times daily. Reported on 08/05/2015    Historical Provider, MD  oxyCODONE-acetaminophen (ROXICET) 5-325 MG tablet Take 1-2 tablets by mouth every 4 (four) hours as needed for severe pain. Patient not taking: Reported on 08/05/2015 04/23/15   Almond Lint, MD   Social History   Social History  . Marital Status: Single    Spouse Name: N/A  . Number of Children: N/A  . Years of Education: N/A   Occupational History  . Not on file.   Social History Main Topics  . Smoking status: Current Every Day Smoker -- 1.00 packs/day for 13 years    Types: Cigarettes  . Smokeless tobacco: Never Used  . Alcohol Use: 0.0 oz/week    0 Standard drinks or equivalent per  week     Comment: occasionally  . Drug Use: No  . Sexual Activity: Not on file   Other Topics Concern  . Not on file   Social History Narrative   Single; no children   Review of Systems  Constitutional: Positive for fever, chills and diaphoresis.  HENT: Positive for congestion, rhinorrhea and sore throat.   Respiratory: Negative for cough.   Cardiovascular: Negative for chest pain.  Gastrointestinal: Positive for abdominal pain and diarrhea (pure water about 7 times yesterday, feel like a little was expelled). Negative for vomiting and blood in  stool.       No mucus in his stool, an no melena  Genitourinary: Negative for urgency and difficulty urinating.  Allergic/Immunologic: Positive for environmental allergies.   [facility] 08/05/2015 01/21/2015 01/07/2015 01/05/2015 01/04/2015  Decreased Interest 0 0 2 0 0  Down, Depressed, Hopeless 0 0 2 0 0  PHQ - 2 Score 0 0 4 0 0  Altered sleeping - 0 2 - -  Tired, decreased energy - 0 1 - -  Change in appetite - 0 2 - -  Feeling bad or failure about yourself  - 0 0 - -  Trouble concentrating - 0 0 - -  Moving slowly or fidgety/restless - 0 0 - -  Suicidal thoughts - 0 0 - -  PHQ-9 Score - 0 9 - -   Objective:   Physical Exam  Constitutional: He appears well-developed and well-nourished. No distress.  HENT:  Head: Normocephalic and atraumatic.  Oropharynx with edema and erythema Nares with edema and erythema  Eyes: Conjunctivae are normal.  Neck: Neck supple. No thyroid mass and no thyromegaly present.  Cardiovascular: Normal rate, regular rhythm, S1 normal, S2 normal and normal heart sounds.  Exam reveals no gallop and no friction rub.   No murmur heard. Pulmonary/Chest: Effort normal and breath sounds normal. No respiratory distress. He has no wheezes. He has no rales.  Abdominal: There is tenderness in the suprapubic area and left lower quadrant. There is no rebound.  Hyperactive bowel sounds tympanic Small umbilical hernia   Lymphadenopathy:  No lymph adenopathy  Neurological: He is alert.  Skin: Skin is warm and dry.  Psychiatric: He has a normal mood and affect. His behavior is normal.  Nursing note and vitals reviewed.   BP 124/78 mmHg  Pulse 73  Temp(Src) 97.4 F (36.3 C) (Oral)  Resp 16  Ht 5\' 8"  (1.727 m)  Wt 250 lb 9.6 oz (113.671 kg)  BMI 38.11 kg/m2  SpO2 98%   Results for orders placed or performed in visit on 08/05/15  POCT CBC  Result Value Ref Range   WBC 7.0 4.6 - 10.2 K/uL   Lymph, poc 1.6 0.6 - 3.4   POC LYMPH PERCENT 22.8 10 -  50 %L   MID (cbc) 0.3 0 - 0.9   POC MID % 4.5 0 - 12 %M   POC Granulocyte 5.1 2 - 6.9   Granulocyte percent 72.7 37 - 80 %G   RBC 5.33 4.69 - 6.13 M/uL   Hemoglobin 15.4 14.1 - 18.1 g/dL   HCT, POC 16.143.4 (A) 09.643.5 - 53.7 %   MCV 81.3 80 - 97 fL   MCH, POC 28.8 27 - 31.2 pg   MCHC 35.4 31.8 - 35.4 g/dL   RDW, POC 04.512.9 %   Platelet Count, POC 171 142 - 424 K/uL   MPV 8.8 0 - 99.8 fL  POCT urinalysis dipstick  Result Value Ref Range   Color, UA yellow yellow   Clarity, UA clear clear   Glucose, UA negative negative   Bilirubin, UA negative negative   Ketones, POC UA negative negative   Spec Grav, UA 1.025    Blood, UA small (A) negative   pH, UA 5.5    Protein Ur, POC negative negative   Urobilinogen, UA 0.2    Nitrite, UA Negative Negative   Leukocytes, UA Negative Negative  POCT Microscopic Urinalysis ([facility])  Result Value Ref Range   WBC,UR,HPF,POC None None WBC/hpf   RBC,UR,HPF,POC None None RBC/hpf   Bacteria None None, Too numerous to count   Mucus Present (A) Absent   Epithelial Cells, UR Per Microscopy None None, Too numerous to count cells/hpf   Assessment & Plan:   1. Abdominal pain, left lower quadrant   2. Diarrhea, unspecified type   3. Seasonal allergies   4. Dehydration   5. Microscopic hematuria   6. Acute URI   7. Gastroenteritis   Symptoms really just started today so suspect this will be a self-limited viral or toxin gastroenteritis. Treat symptomatically  Orders Placed This Encounter  Procedures  . Comprehensive metabolic panel  . Lipase  . POCT CBC  . POCT urinalysis dipstick  . POCT Microscopic Urinalysis ([facility])  . POC Hemoccult Bld/Stl (3-Cd Home Screen)    Standing Status: Future     Number of Occurrences:      Standing Expiration Date: 08/04/2016    Meds ordered this encounter  Medications  . chlorhexidine (PERIDEX) 0.12 % solution    Sig: RINSE WITH FOR 30 SECONDS TWICE DAILY MORNING AND EVENING X 2WEEKS THEN DISCONTINUE USE     Refill:  0  . triamcinolone cream (KENALOG) 0.1 %    Sig:     Refill:  0  . loperamide (IMODIUM) 2 MG capsule    Sig: Take 1 capsule (2 mg total) by mouth as needed for diarrhea or loose stools. Do not use more than 8 caps in 24 hours    Dispense:  30 capsule    Refill:  0  . promethazine-phenylephrine (PROMETHAZINE VC) 6.25-5 MG/5ML SYRP    Sig: Take 5-10 mLs by mouth every 4 (four) hours as needed for congestion.    Dispense:  280 mL    Refill:  0    I personally performed the services described in this documentation, which was scribed in my presence. The recorded information has been reviewed and considered, and addended by me as needed.   [name], M.D.  [facility] [address] [place], [place] [address] [phone] phone [phone] fax  08/12/2015 9:43 AM

## 2015-08-15 LAB — POC HEMOCCULT BLD/STL (HOME/3-CARD/SCREEN)
Card #3 Fecal Occult Blood, POC: NEGATIVE
FECAL OCCULT BLD: NEGATIVE
Fecal Occult Blood, POC: NEGATIVE

## 2015-08-15 NOTE — Addendum Note (Signed)
Addended by: Thelma BargeICHARDSON, SHEKETIA D on: 08/15/2015 07:19 PM   Modules accepted: Orders

## 2015-08-20 ENCOUNTER — Encounter: Payer: Self-pay | Admitting: Family Medicine

## 2015-08-20 ENCOUNTER — Telehealth: Payer: Self-pay

## 2015-08-20 NOTE — Telephone Encounter (Signed)
This will require an OV, correct? Or can we even write the letter?

## 2015-08-20 NOTE — Telephone Encounter (Signed)
Pt sister called saying he will need a letter since [place] is [place] to state that he is in good health to leave the country to get married. She is not sure if he will need to come back in or if the note will do since he was last seen 08/05/15  Please advise  [phone]

## 2015-08-20 NOTE — Telephone Encounter (Signed)
Congratulations!!  I had no idea he had upcoming nuptials!  Very exciting.  I know this pt well so will be happy to provide a letter. We recently did his labs and he had a full physical last yr so I don't think it should be a problem unless there is something very specific. For now, will just write general letter and hopefully that will work.

## 2015-08-20 NOTE — Telephone Encounter (Signed)
Letter sent to pool.

## 2015-08-21 NOTE — Telephone Encounter (Signed)
Sister advised ready to pick up.

## 2016-06-08 DIAGNOSIS — L738 Other specified follicular disorders: Secondary | ICD-10-CM | POA: Diagnosis not present

## 2016-06-08 DIAGNOSIS — L700 Acne vulgaris: Secondary | ICD-10-CM | POA: Diagnosis not present

## 2016-06-25 ENCOUNTER — Ambulatory Visit (INDEPENDENT_AMBULATORY_CARE_PROVIDER_SITE_OTHER): Payer: BLUE CROSS/BLUE SHIELD | Admitting: Family Medicine

## 2016-06-25 VITALS — BP 131/82 | HR 67 | Temp 98.1°F | Resp 16 | Ht 68.5 in | Wt 261.4 lb

## 2016-06-25 DIAGNOSIS — R630 Anorexia: Secondary | ICD-10-CM | POA: Diagnosis not present

## 2016-06-25 DIAGNOSIS — B001 Herpesviral vesicular dermatitis: Secondary | ICD-10-CM | POA: Diagnosis not present

## 2016-06-25 DIAGNOSIS — E559 Vitamin D deficiency, unspecified: Secondary | ICD-10-CM | POA: Diagnosis not present

## 2016-06-25 DIAGNOSIS — F17200 Nicotine dependence, unspecified, uncomplicated: Secondary | ICD-10-CM

## 2016-06-25 DIAGNOSIS — E213 Hyperparathyroidism, unspecified: Secondary | ICD-10-CM

## 2016-06-25 DIAGNOSIS — R140 Abdominal distension (gaseous): Secondary | ICD-10-CM

## 2016-06-25 DIAGNOSIS — R7309 Other abnormal glucose: Secondary | ICD-10-CM

## 2016-06-25 DIAGNOSIS — J029 Acute pharyngitis, unspecified: Secondary | ICD-10-CM

## 2016-06-25 LAB — POCT RAPID STREP A (OFFICE): RAPID STREP A SCREEN: NEGATIVE

## 2016-06-25 MED ORDER — CEPHALEXIN 500 MG PO CAPS: 500.0000 mg | ORAL_CAPSULE | Freq: Two times a day (BID) | ORAL | 0 refills | Status: DC

## 2016-06-25 MED ORDER — ULTRAFLORA IMMUNE HEALTH 170 MG PO CAPS: 1.0000 | ORAL_CAPSULE | Freq: Every day | ORAL | 11 refills | Status: DC

## 2016-06-25 MED ORDER — ACYCLOVIR 400 MG PO TABS: 800.0000 mg | ORAL_TABLET | Freq: Three times a day (TID) | ORAL | 1 refills | Status: DC

## 2016-06-25 NOTE — Progress Notes (Addendum)
Subjective:  By signing my name below, I, Larry Hayden, attest that this documentation has been prepared under the direction and in the presence of [name], MD Electronically Signed: Charline Bills, ED Scribe 06/25/2016 at 9:14 AM.   Patient ID: Larry Hayden, male    DOB: 03/21/1985, 31 y.o.   MRN: [id number]  Chief Complaint  Patient presents with  . sore on mouth    left side of bottom lip x 6 days  . Sore Throat    x 6 days   HPI Larry Hayden is a 31 y.o. male who presents to Primary Care at [facility] for a follow-up. H/o very severe acne and recurrent abscesses. Seen dermatology and dentist, on daily minocycline several years prior. Also had a rather severe left parotid gland abscess 2 years prior.   Today, he reports a gradually improving sore throat onset 6 days ago. Pt started Keflex twice daily for 5 days which he states has improved sore throat significantly but he is still experiencing a mild sore throat today. He reports associated symptoms of fever and a fever blister to the left bottom lip for 6 days. Pt reports feeling a burning and tingling sensation to his lip prior to noticing the mouth sore. States he tends to get similar mouth sores at least 3 times/year when he becomes ill which he treats with a topical gel.   Pt also reports some abdominal distention, increased flatulence every morning and loss of appetite. He states that he typically eats a banana and small meals and "feels full". Pt denies abdominal pain, constipation, heartburn.   Originally from [place] where he did not have any reg medical care. Is engaged but his fiance is still there. Here with family inc sisters Asseel and Francena Hanly who are also my pts. Pt no longer requires translator - going to college and working.   Past Medical History:  Diagnosis Date  . Acne   . History of hypercalcemia 2015  . Hyperactive gag reflex   . Hyperparathyroidism (HCC)   . Pilonidal sinus 04/2015  . Tooth injury    right  upper front - hx. of repair  . Vitamin D deficiency    Current Outpatient Prescriptions on File Prior to Visit  Medication Sig Dispense Refill  . cholecalciferol (VITAMIN D) 1000 units tablet Take 1,000 Units by mouth daily.    . chlorhexidine (PERIDEX) 0.12 % solution RINSE WITH FOR 30 SECONDS TWICE DAILY MORNING AND EVENING X 2WEEKS THEN DISCONTINUE USE  0  . loperamide (IMODIUM) 2 MG capsule Take 1 capsule (2 mg total) by mouth as needed for diarrhea or loose stools. Do not use more than 8 caps in 24 hours (Patient not taking: Reported on 06/25/2016) 30 capsule 0  . promethazine-phenylephrine (PROMETHAZINE VC) 6.25-5 MG/5ML SYRP Take 5-10 mLs by mouth every 4 (four) hours as needed for congestion. (Patient not taking: Reported on 06/25/2016) 280 mL 0  . triamcinolone cream (KENALOG) 0.1 %   0   No current facility-administered medications on file prior to visit.    No Known Allergies   Past Surgical History:  Procedure Laterality Date  . NO PAST SURGERIES    . PILONIDAL CYST EXCISION N/A 04/23/2015   Procedure: CYST EXCISION PILONIDAL EXTENSIVE;  Surgeon: Almond Lint, MD;  Location: [facility];  Service: General;  Laterality: N/A;   Family History  Problem Relation Age of Onset  . Cancer Mother   . Heart disease Maternal Grandmother    Social  History   Social History  . Marital status: Single    Spouse name: N/A  . Number of children: N/A  . Years of education: N/A   Social History Main Topics  . Smoking status: Current Every Day Smoker    Packs/day: 1.00    Years: 13.00    Types: Cigarettes  . Smokeless tobacco: Never Used  . Alcohol use 0.0 oz/week     Comment: occasionally  . Drug use: No  . Sexual activity: Not Asked   Other Topics Concern  . None   Social History Narrative   Single; no children   Depression screen [facility] 2/9 06/25/2016 08/05/2015 01/21/2015 01/07/2015 01/05/2015  Decreased Interest 0 0 0 2 0  Down, Depressed, Hopeless 0 0 0 2  0  PHQ - 2 Score 0 0 0 4 0  Altered sleeping - - 0 2 -  Tired, decreased energy - - 0 1 -  Change in appetite - - 0 2 -  Feeling bad or failure about yourself  - - 0 0 -  Trouble concentrating - - 0 0 -  Moving slowly or fidgety/restless - - 0 0 -  Suicidal thoughts - - 0 0 -  PHQ-9 Score - - 0 9 -    Review of Systems  Constitutional: Positive for appetite change. Negative for activity change, chills and fever.  HENT: Positive for mouth sores and sore throat.   Gastrointestinal: Positive for abdominal distention and abdominal pain. Negative for constipation, diarrhea and nausea.  Musculoskeletal: Negative for arthralgias, back pain and joint swelling.  Hematological: Negative for adenopathy. Does not bruise/bleed easily.  Psychiatric/Behavioral: Negative for dysphoric mood. The patient is not nervous/anxious.       Objective:   Physical Exam  Constitutional: He is oriented to person, place, and time. He appears well-developed and well-nourished. No distress.  HENT:  Head: Normocephalic and atraumatic.  Right Ear: Tympanic membrane is injected and retracted.  Left Ear: Tympanic membrane is injected and retracted.  Mouth/Throat: Posterior oropharyngeal erythema present. No oropharyngeal exudate.  Nose: Nares with erythema.  Mouth: 2+ tonsils. Healing 3-4 mm ulcerated fever blister on L lower lip labial border.  Eyes: Conjunctivae and EOM are normal.  Neck: Neck supple. No tracheal deviation present.  Cardiovascular: Normal rate, regular rhythm and normal heart sounds.   Pulmonary/Chest: Effort normal and breath sounds normal. No respiratory distress.  Musculoskeletal: Normal range of motion.  Neurological: He is alert and oriented to person, place, and time.  Skin: Skin is warm and dry.  Psychiatric: He has a normal mood and affect. His behavior is normal.  Nursing note and vitals reviewed.  BP 131/82   Pulse 67   Temp 98.1 F (36.7 C) (Oral)   Resp 16   Ht 5' 8.5" (1.74  m)   Wt 261 lb 6.4 oz (118.6 kg)   SpO2 96%   BMI 39.17 kg/m    Results for orders placed or performed in visit on 06/25/16  POCT rapid strep A  Result Value Ref Range   Rapid Strep A Screen Negative Negative      Assessment & Plan:   1. Acute pharyngitis, unspecified etiology - resolving on keflex so finish course  2. Decreased appetite - try probiotics, h. Pylori P  3. Abdominal bloating   4. Hyperparathyroidism (HCC) - dx 03/2013 when PT was 89 w/ Ca 11, nml parathyroid sestamibi scan 07/2013. Recheck labs today - labs show non-parathyroid hypercalcemia. Saw Dr. 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  5. Herpes labialis - try antiviral at first sign of outbreak.  6. Vitamin D deficiency - start once wkly high dose supp x 6 mos, then change to otc 4000-5000u/d. Recheck at next OV.  7.       Tobacco use - discuss cessation at f/u 8.       Elev gluc - add on a1c  Orders Placed This Encounter  Procedures  . Culture, Group A Strep    Order Specific Question:   Source    Answer:   oropharynx  . H. pylori breath test  . Comprehensive metabolic panel  . CBC  . PTH, Intact and Calcium  . VITAMIN D 25 Hydroxy (Vit-D Deficiency, Fractures)  . Care order/instruction:    Scheduling Instructions:     Complete orders, AVS and go.  Marland Kitchen POCT rapid strep A    Meds ordered this encounter  Medications  . acyclovir (ZOVIRAX) 400 MG tablet    Sig: Take 2 tablets (800 mg total) by mouth 3 (three) times daily. x2d at first sign of cold sore    Dispense:  36 tablet    Refill:  1  . cephALEXin (KEFLEX) 500 MG capsule    Sig: Take 1 capsule (500 mg total) by mouth 2 (two) times daily. For 10d    Dispense:  20 capsule    Refill:  0  . Probiotic Product (ULTRAFLORA IMMUNE HEALTH) 170 MG CAPS    Sig: Take 1 capsule by mouth daily.     Dispense:  30 capsule    Refill:  11    Would like pt on UltraFlora IB but ok to substitute equivalent product if needed   Over 40 min spent in face-to-face evaluation of and consultation with patient and coordination of care.  Over 50% of this time was spent counseling this patient.  I personally performed the services described in this documentation, which was scribed in my presence. The recorded information has been reviewed and considered, and addended by me as needed.   [name], M.D.  Primary Care at [facility] [address] [place], [place] [address] [phone] phone [phone] fax  06/26/16 9:20 AM

## 2016-06-25 NOTE — Patient Instructions (Addendum)
Consider trying UltraFlora IB - it is very expensive but try [employer] or online for the best price. The prescription will at least eliminate the tax. I think one a day will help your appetite and bloating significantly. Use the acyclovir at the very first sign of the cold sore - the sooner you start it the more effective it will be.    IF you received an x-ray today, you will receive an invoice from [facility]. Please contact [facility] at [phone] with questions or concerns regarding your invoice.   IF you received labwork today, you will receive an invoice from [facility]. Please contact [facility] at [phone] with questions or concerns regarding your invoice.   Our billing staff will not be able to assist you with questions regarding bills from these companies.  You will be contacted with the lab results as soon as they are available. The fastest way to get your results is to activate your My Chart account. Instructions are located on the last page of this paperwork. If you have not heard from [place] regarding the results in 2 weeks, please contact this office.    Probiotics What are probiotics? Probiotics are the good bacteria and yeasts that live in your body and keep you and your digestive system healthy. Probiotics also help your body's defense (immune) system and protect your body against bad bacterial growth. Certain foods contain probiotics, such as yogurt. Probiotics can also be purchased as a supplement. As with any supplement or drug, it is important to discuss its use with your health care provider. What affects the balance of bacteria in my body? The balance of bacteria in your body can be affected by:  Antibiotic medicines. Antibiotics are sometimes necessary to treat infection. Unfortunately, they may kill good or friendly bacteria in your body as well as the bad bacteria. This may lead to stomach problems like diarrhea, gas, and  cramping.  Disease. Some conditions are the result of an overgrowth of bad bacteria, yeasts, parasites, or fungi. These conditions include:  Infectious diarrhea.  Stomach and respiratory infections.  Skin infections.  Irritable bowel syndrome (IBS).  Inflammatory bowel diseases.  Ulcer due to Helicobacter pylori (H. pylori) infection.  Tooth decay and periodontal disease.  Vaginal infections. Stress and poor diet may also lower the good bacteria in your body. What type of probiotic is right for me? Probiotics are available over the counter at your local pharmacy, health food, or grocery store. They come in many different forms, combinations of strains, and dosing strengths. Some may need to be refrigerated. Always read the label for storage and usage instructions. Specific strains have been shown to be more effective for certain conditions. Ask your health care provider what option is best for you. Why would I need probiotics? There are many reasons your health care provider might recommend a probiotic supplement, including:  Diarrhea.  Constipation.  IBS.  Respiratory infections.  Yeast infections.  Acne, eczema, and other skin conditions.  Frequent urinary tract infections (UTIs). Are there side effects of probiotics? Some people experience mild side effects when taking probiotics. Side effects are usually temporary and may include:  Gas.  Bloating.  Cramping. Rarely, serious side effects, such as infection or immune system changes, may occur. What else do I need to know about probiotics?  There are many different strains of probiotics. Certain strains may be more effective depending on your condition. Probiotics are available in varying doses. Ask your health care provider which probiotic  you should use and how often.  If you are taking probiotics along with antibiotics, it is generally recommended to wait at least 2 hours between taking the antibiotic and  taking the probiotic. For more information: [facility] [email or web address].[email or web address]/ This information is not intended to replace advice given to you by your health care provider. Make sure you discuss any questions you have with your health care provider. Document Released: 09/19/2013 Document Revised: 01/20/2016 Document Reviewed: 05/22/2013 Elsevier Interactive Patient Education  2017 [employer].

## 2016-06-26 LAB — COMPREHENSIVE METABOLIC PANEL
A/G RATIO: 1.5 (ref 1.2–2.2)
ALK PHOS: 80 IU/L (ref 39–117)
ALT: 57 IU/L — AB (ref 0–44)
AST: 25 IU/L (ref 0–40)
Albumin: 4.3 g/dL (ref 3.5–5.5)
BILIRUBIN TOTAL: 0.5 mg/dL (ref 0.0–1.2)
BUN/Creatinine Ratio: 24 — ABNORMAL HIGH (ref 9–20)
BUN: 18 mg/dL (ref 6–20)
CHLORIDE: 99 mmol/L (ref 96–106)
CO2: 25 mmol/L (ref 18–29)
Calcium: 10.8 mg/dL — ABNORMAL HIGH (ref 8.7–10.2)
Creatinine, Ser: 0.74 mg/dL — ABNORMAL LOW (ref 0.76–1.27)
GFR calc Af Amer: 141 mL/min/{1.73_m2} (ref >59)
GFR calc non Af Amer: 122 mL/min/{1.73_m2} (ref >59)
GLUCOSE: 152 mg/dL — AB (ref 65–99)
Globulin, Total: 2.8 g/dL (ref 1.5–4.5)
POTASSIUM: 3.9 mmol/L (ref 3.5–5.2)
Sodium: 138 mmol/L (ref 134–144)
Total Protein: 7.1 g/dL (ref 6.0–8.5)

## 2016-06-26 LAB — H. PYLORI BREATH TEST: H. pylori UBiT: NEGATIVE

## 2016-06-26 LAB — VITAMIN D 25 HYDROXY (VIT D DEFICIENCY, FRACTURES): VIT D 25 HYDROXY: 17.7 ng/mL — AB (ref 30.0–100.0)

## 2016-06-26 LAB — CBC
HEMOGLOBIN: 15.9 g/dL (ref 13.0–17.7)
Hematocrit: 45.7 % (ref 37.5–51.0)
MCH: 27.7 pg (ref 26.6–33.0)
MCHC: 34.8 g/dL (ref 31.5–35.7)
MCV: 80 fL (ref 79–97)
Platelets: 236 10*3/uL (ref 150–379)
RBC: 5.74 x10E6/uL (ref 4.14–5.80)
RDW: 13.1 % (ref 12.3–15.4)
WBC: 7.1 10*3/uL (ref 3.4–10.8)

## 2016-06-26 LAB — PTH, INTACT AND CALCIUM
CALCIUM: 10.7 mg/dL — AB (ref 8.7–10.2)
PTH: 34 pg/mL (ref 15–65)

## 2016-06-26 MED ORDER — VITAMIN D (ERGOCALCIFEROL) 1.25 MG (50000 UNIT) PO CAPS: 50000.0000 [IU] | ORAL_CAPSULE | ORAL | 0 refills | Status: DC

## 2016-06-28 LAB — CULTURE, GROUP A STREP

## 2016-07-03 LAB — SPECIMEN STATUS REPORT

## 2016-07-03 LAB — HGB A1C W/O EAG: HEMOGLOBIN A1C: 5.9 % — AB (ref 4.8–5.6)

## 2016-07-04 IMAGING — CT CT NECK W/ CM
4 of 5 series · 16 of 33 positions shown, 18 images · IV contrast (OMNIPAQUE 300)
Comparison: None.

CLINICAL DATA: Right neck mass and pain within the right parotid
region.

EXAM:
CT NECK WITH CONTRAST
TECHNIQUE: Multidetector CT imaging of the neck was performed using the
standard protocol following the bolus administration of intravenous
contrast.
CONTRAST:  100mL OMNIPAQUE IOHEXOL 300 MG/ML  SOLN

[Series 3: neck with st · axial · 0.43mm/px · z∈[-347,-203]mm · 4 of 120 slices shown, 5 images]
[im 24/120  soft-tissue]
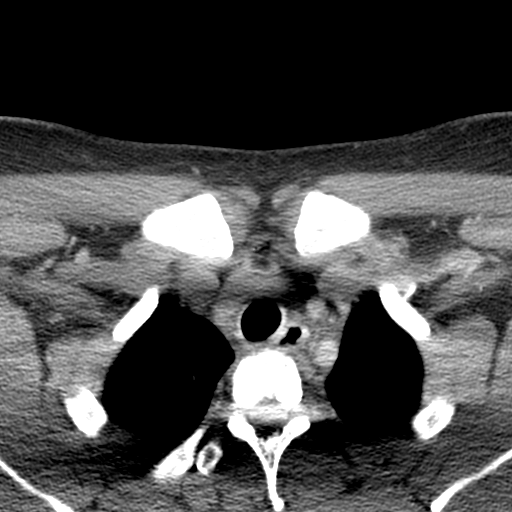
[im 24/120  bone]
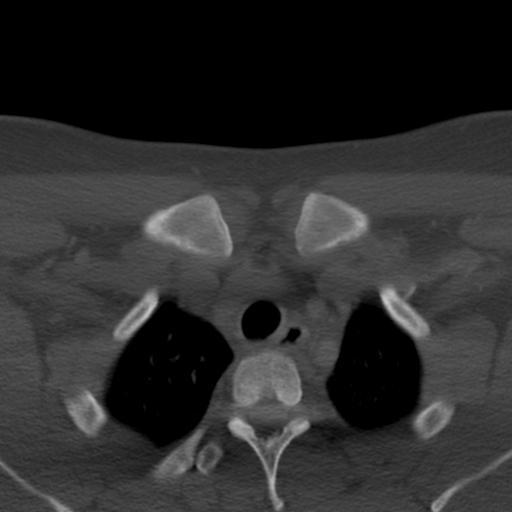
[im 48/120  bone]
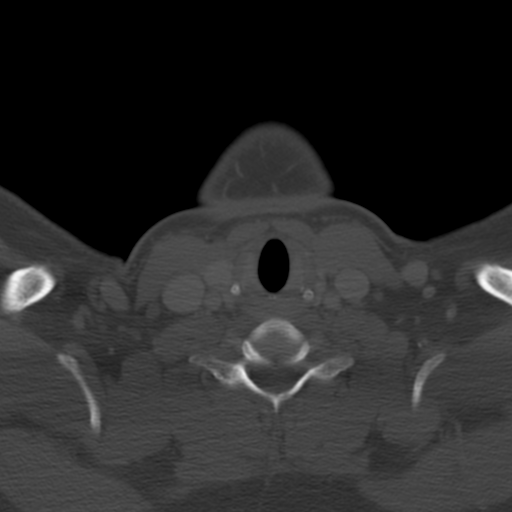
[im 72/120  bone]
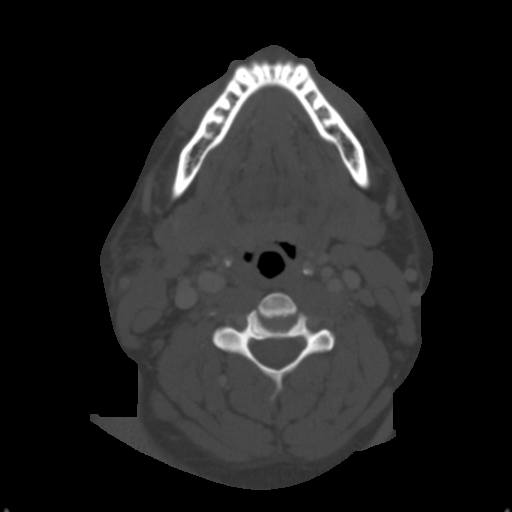
[im 96/120  bone]
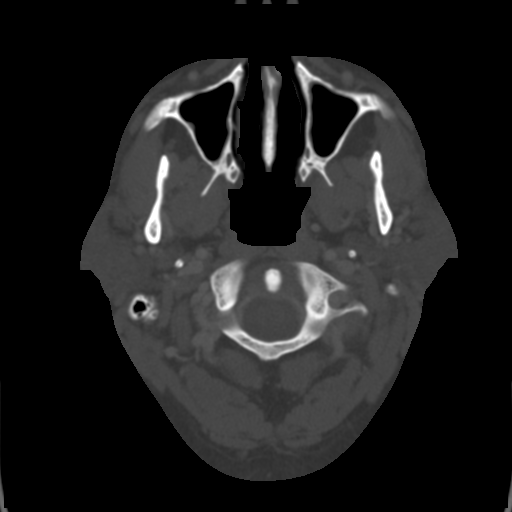

[Series 6: coronal st · coronal · 0.52mm/px · 3 of 106 slices shown]
[im 24/106  bone]
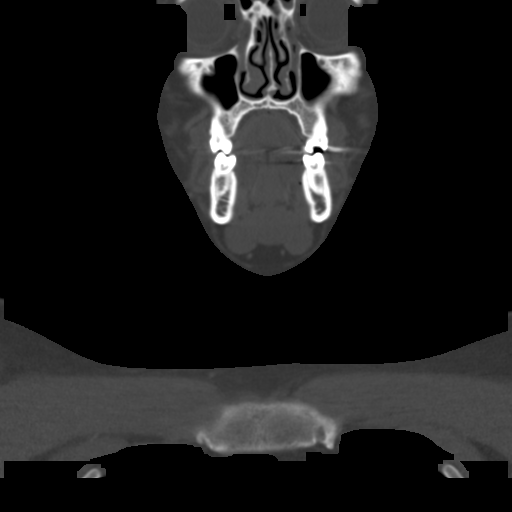
[im 43/106  bone]
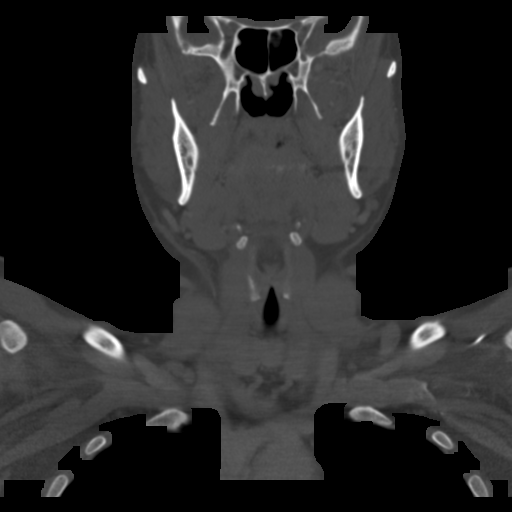
[im 63/106  bone]
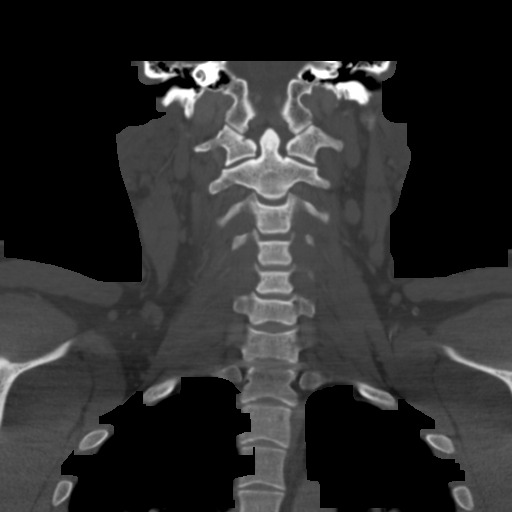

[Series 7: sagittal st · sagittal · 0.49mm/px · 5 of 101 slices shown, 6 images]
[im 34/101  bone]
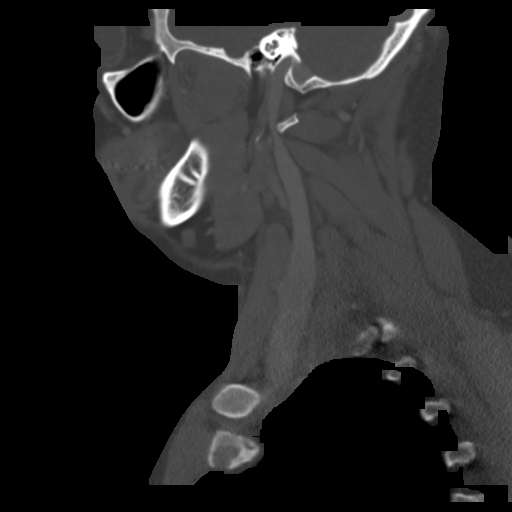
[im 42/101  bone]
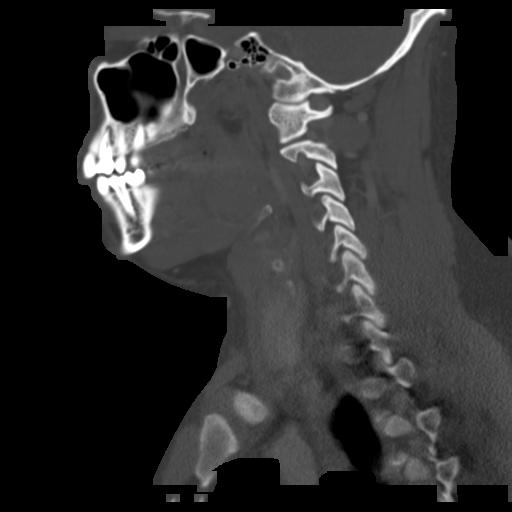
[im 51/101  soft-tissue]
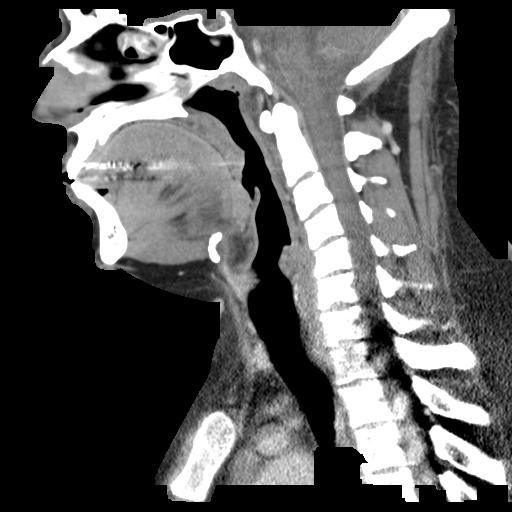
[im 51/101  bone]
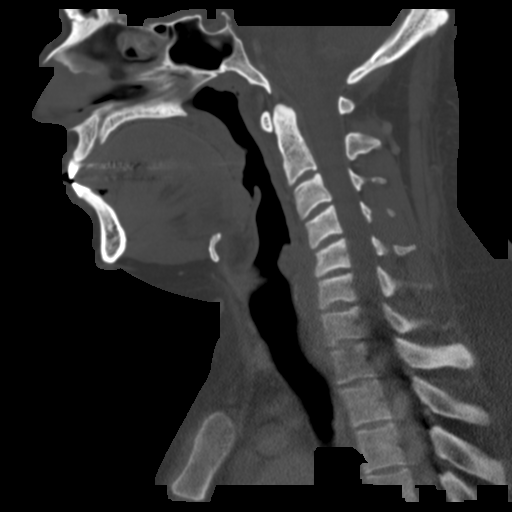
[im 59/101  bone]
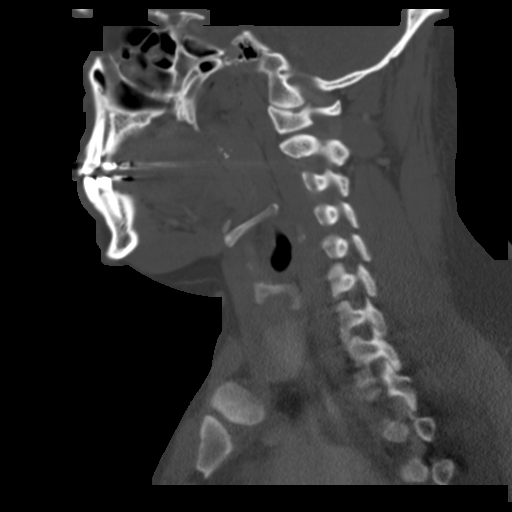
[im 67/101  bone]
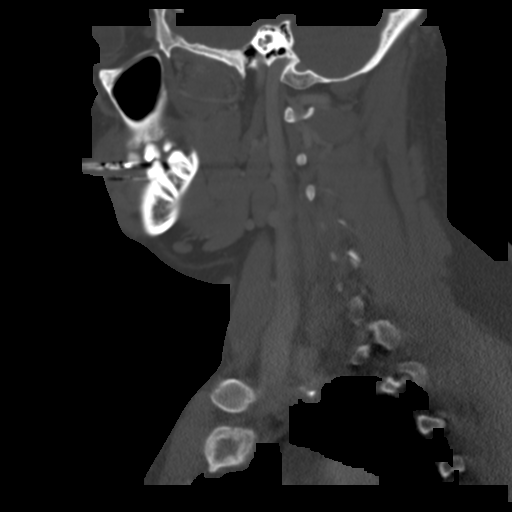

[Series 8: axial recons · axial · 0.39mm/px · z∈[-380,-230]mm · 4 of 130 slices shown]
[im 26/130  bone]
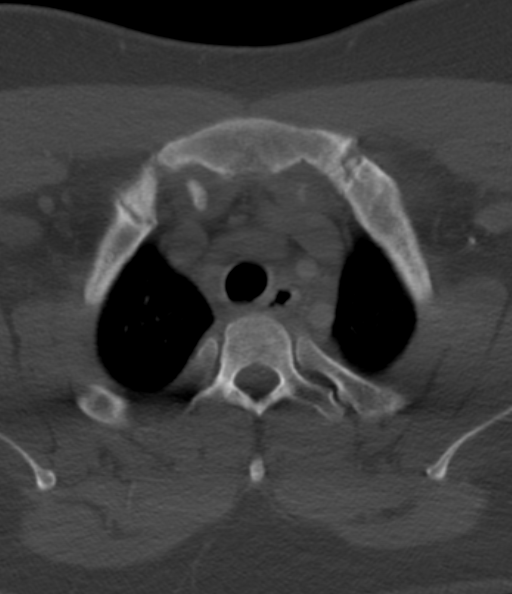
[im 52/130  bone]
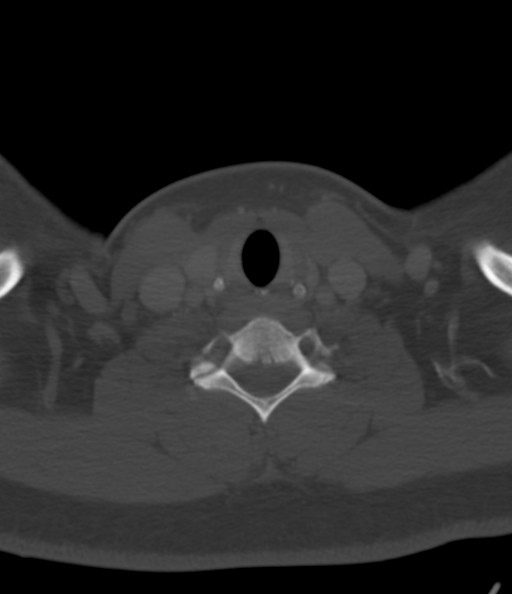
[im 78/130  bone]
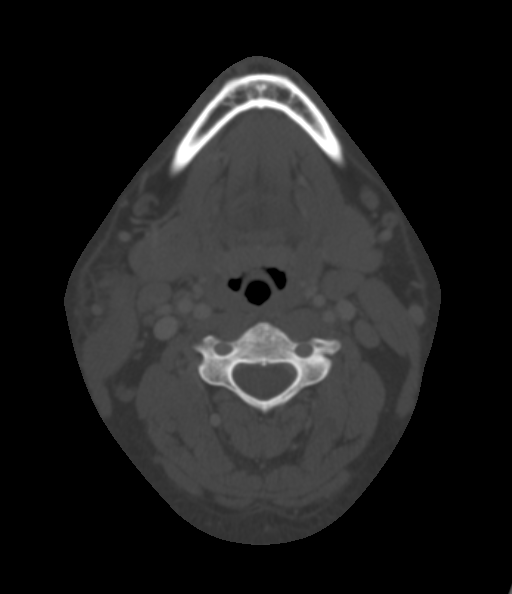
[im 104/130  bone]
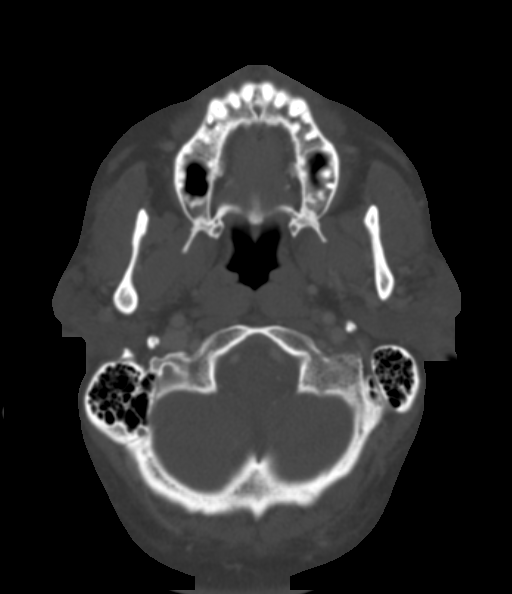

[16 of 33 positions shown; findings below may reference images not displayed]

FINDINGS: Pharynx and Larynx: No evidence of mass.

Salivary Glands: There is a 1.3 by 1.1 x 1.3 cm thick rim enhancing
fluid-filled structure within the inferior portion of the right
parotid gland.

Thyroid:  No concerning nodule.

Lymph nodes: No necrotic appearing lymph nodes. There is a 1.8 cm
right submandibular lymph node with reactive appearance. 0.6 cm
rounded lymph node is seen in the right submental region.

Skeleton: No acute or aggressive findings.

Upper Chest: No adenopathy or suspicious pulmonary nodule.
IMPRESSION: 1.3 cm thick rim enhancing fluid-filled structure within the
inferior portion of the right parotid gland, with associated mild
likely reactive right submandibular and submental adenopathy. No
evidence of associated sialolithiasis. Differential diagnosis
includes parotid gland abscess, necrotic intraparotid lymph node, a
necrotic parotid mass. Re-evaluation after empiric antibiotic
treatment may be considered to assure resolution.

## 2016-09-17 ENCOUNTER — Encounter: Payer: Self-pay | Admitting: Family Medicine

## 2016-09-17 ENCOUNTER — Encounter: Payer: Self-pay | Admitting: Internal Medicine

## 2016-09-17 ENCOUNTER — Ambulatory Visit (INDEPENDENT_AMBULATORY_CARE_PROVIDER_SITE_OTHER): Payer: BLUE CROSS/BLUE SHIELD | Admitting: Family Medicine

## 2016-09-17 ENCOUNTER — Ambulatory Visit (INDEPENDENT_AMBULATORY_CARE_PROVIDER_SITE_OTHER): Payer: BLUE CROSS/BLUE SHIELD

## 2016-09-17 VITALS — BP 146/82 | HR 75 | Temp 98.7°F | Resp 16 | Ht 68.5 in | Wt 264.8 lb

## 2016-09-17 DIAGNOSIS — E559 Vitamin D deficiency, unspecified: Secondary | ICD-10-CM | POA: Diagnosis not present

## 2016-09-17 DIAGNOSIS — R7303 Prediabetes: Secondary | ICD-10-CM | POA: Diagnosis not present

## 2016-09-17 DIAGNOSIS — G8929 Other chronic pain: Secondary | ICD-10-CM

## 2016-09-17 DIAGNOSIS — M2619 Other specified anomalies of jaw-cranial base relationship: Secondary | ICD-10-CM | POA: Diagnosis not present

## 2016-09-17 DIAGNOSIS — R140 Abdominal distension (gaseous): Secondary | ICD-10-CM

## 2016-09-17 DIAGNOSIS — M25561 Pain in right knee: Secondary | ICD-10-CM

## 2016-09-17 DIAGNOSIS — R635 Abnormal weight gain: Secondary | ICD-10-CM

## 2016-09-17 LAB — POCT CBC
LYMPH, POC: 2.3 (ref 0.6–3.4)
MID (cbc): 0.4 (ref 0–0.9)
POC GRANULOCYTE: 4.4 (ref 2–6.9)
POC LYMPH PERCENT: 32.6 %L (ref 10–50)
POC MID %: 5.4 % (ref 0–12)
WBC: 7.1 10*3/uL (ref 4.6–10.2)

## 2016-09-17 MED ORDER — CIPROFLOXACIN HCL 500 MG PO TABS: 500.0000 mg | ORAL_TABLET | Freq: Two times a day (BID) | ORAL | 0 refills | Status: DC

## 2016-09-17 MED FILL — CIPROFLOXACIN HCL 500 MG TA: 500 | 5 days supply | Qty: 10 | Fill #0

## 2016-09-17 NOTE — Progress Notes (Addendum)
Subjective:  By signing my name below, I, Stann Ore, attest that this documentation has been prepared under the direction and in the presence of [name], MD. Electronically Signed: Stann Ore, Scribe. 09/17/2016 , 12:30 PM .  Patient was seen in Room 11 .   Patient ID: Larry Hayden, male    DOB: May 28, 1985, 31 y.o.   MRN: [id number] Chief Complaint  Patient presents with  . Abdominal Pain    States that after he eat he feels "abdominal heaviness"    HPI Larry Hayden is a 31 y.o. male who presents to Primary Care at [facility] complaining of abdominal pain. Patient has had recurrent complaints of GI symptoms. He was seen a year ago for diarrhea and bloating, suspected to be viral. Then, 3 months ago, he was seen again with complaints of bloating, flatulence, and decreased appetite with early satiety. He was recommended to start UltraFlora IB probiotic. He had H. Pylori breath test done, which was negative at last visit.   Patient states his symptoms improved while taking the UltraFlora IB probiotic. But after he finished, his stomach feels bloated after eating. His bowels have improved, but still has occasional diarrhea. He's been mostly eating just breakfast. He would sleep at around 12:30-1:00AM, but doesn't want to eat at that hour before he sleeps. He also has an occasional banana and apple, but still has bloating with it.   He also reports right knee pain for standing at work all day with long hours. He bought a knee brace at the pharmacy and has been wearing it for relief.   Past Medical History:  Diagnosis Date  . Acne   . History of hypercalcemia 2015  . Hyperactive gag reflex   . Hyperparathyroidism (HCC)   . Pilonidal sinus 04/2015  . Tooth injury    right upper front - hx. of repair  . Vitamin D deficiency    Prior to Admission medications   Medication Sig Start Date End Date Taking? Authorizing Provider  acyclovir (ZOVIRAX) 400 MG tablet Take 2 tablets (800 mg  total) by mouth 3 (three) times daily. x2d at first sign of cold sore 06/25/16   Sherren Mocha, MD  cephALEXin (KEFLEX) 500 MG capsule Take 1 capsule (500 mg total) by mouth 2 (two) times daily. For 10d 06/25/16   Sherren Mocha, MD  chlorhexidine (PERIDEX) 0.12 % solution RINSE WITH FOR 30 SECONDS TWICE DAILY MORNING AND EVENING X 2WEEKS THEN DISCONTINUE USE 05/19/15   [provider]  cholecalciferol (VITAMIN D) 1000 units tablet Take 1,000 Units by mouth daily.    [provider]  Probiotic Product (ULTRAFLORA IMMUNE HEALTH) 170 MG CAPS Take 1 capsule by mouth daily. 06/25/16   Sherren Mocha, MD  triamcinolone cream (KENALOG) 0.1 %  05/27/15   [provider]  Vitamin D, Ergocalciferol, (DRISDOL) 50000 units CAPS capsule Take 1 capsule (50,000 Units total) by mouth every 7 (seven) days. 06/26/16   Sherren Mocha, MD   No Known Allergies  Review of Systems  Constitutional: Negative for fatigue and unexpected weight change.  Eyes: Negative for visual disturbance.  Respiratory: Negative for cough, chest tightness and shortness of breath.   Cardiovascular: Negative for chest pain, palpitations and leg swelling.  Gastrointestinal: Positive for abdominal distention. Negative for abdominal pain and blood in stool.  Neurological: Negative for dizziness, light-headedness and headaches.       Objective:   Physical Exam  Constitutional: He is oriented to person, place,  and time. He appears well-developed and well-nourished. No distress.  HENT:  Head: Normocephalic and atraumatic.  Eyes: Pupils are equal, round, and reactive to light. EOM are normal.  Neck: Neck supple.  Cardiovascular: Normal rate.   Pulmonary/Chest: Effort normal. No respiratory distress.  Abdominal: Bowel sounds are increased. There is tenderness (slight) in the left lower quadrant.  Hyperactive bowel sounds  Musculoskeletal: Normal range of motion.  Right knee: full ROM, there is mild tenderness over  medial jointline, no patellar instability, there is some tenderness with valgus stress  Neurological: He is alert and oriented to person, place, and time.  Skin: Skin is warm and dry.  Psychiatric: He has a normal mood and affect. His behavior is normal.  Nursing note and vitals reviewed.   BP (!) 146/82   Pulse 75   Temp 98.7 F (37.1 C) (Oral)   Resp 16   Ht 5' 8.5" (1.74 m)   Wt 264 lb 12.8 oz (120.1 kg)   SpO2 96%   BMI 39.68 kg/m   Dg Knee 1-2 Views Right  Result Date: 09/17/2016 CLINICAL DATA:  Chronic right knee pain EXAM: RIGHT KNEE - 1-2 VIEW COMPARISON:  None. FINDINGS: No fracture or dislocation is seen. The joint spaces are preserved. The visualized soft tissues are unremarkable. IMPRESSION: Negative. Electronically Signed   By: Charline BillsSriyesh  Krishnan M.D.   On: 09/17/2016 12:53   Dg Abd Acute W/chest  Result Date: 09/17/2016 CLINICAL DATA:  Abdominal distension. EXAM: DG ABDOMEN ACUTE W/ 1V CHEST COMPARISON:  None. FINDINGS: The upright chest x-ray is normal. The cardiac silhouette, mediastinal and hilar contours are within normal limits and the lungs are clear. No pleural effusion. Two views of the abdomen demonstrate an unremarkable bowel gas pattern. No findings for obstruction or perforation. The soft tissue shadows are maintained. No worrisome calcifications. IMPRESSION: Unremarkable acute abdominal series. Electronically Signed   By: Rudie MeyerP.  Gallerani M.D.   On: 09/17/2016 12:59       Assessment & Plan:   1. Abdominal distension, gaseous - ultraIB flora helped while on it but to expensive to continue long term and sxs continue to worsen now that off, try cipro. increase fiber in diet, try low dose fiber supp (warned initially may make sxs worse). Refer to GI for further eval  2. Prediabetes   3. Chronic pain of right knee - functional with physically active job - gave [employer] brace in office which provided sig relief - wear during work.  4. Weight gain, abnormal     5. Vitamin D deficiency - still low - has always been low, important to replace to normal levels due to piror calcium/PTH abnml in order to more accurately assess for any underlying disorder. - states ran out of high dose (though I thought I sent in a 6 mo supply (24 pills) 3 mos ago but perhaps insurance will only over a 3 mo supply and then in didn't have refills??  Language is a barrier here to finding out exactly but as is still sig low (though improving), sent in a 1 yr supply of the once weekly high dose. Recheck in 3-6 mos as will want to recheck ca/pth (see Dr. Elvera LennoxGherghe' note) after D is fully replaced. Lab Results  Component Value Date   VD25OH 21.2 (L) 09/17/2016   VD25OH 17.7 (L) 06/25/2016   VD25OH 14 (L) 01/21/2015     6. Extended chin - interested in injections to melt the fat - aware of potential cost,  referred to plastics    Orders Placed This Encounter  Procedures  . DG Abd Acute W/Chest    Standing Status:   Future    Number of Occurrences:   1    Standing Expiration Date:   09/17/2017    Order Specific Question:   Reason for exam:    Answer:   worsening abdominal distention/bloating/gass    Order Specific Question:   Preferred imaging location?    Answer:   External  . DG Knee 1-2 Views Right    Standing Status:   Future    Number of Occurrences:   1    Standing Expiration Date:   09/17/2017    Order Specific Question:   Reason for Exam (SYMPTOM  OR DIAGNOSIS REQUIRED)    Answer:   pain in medial knee    Order Specific Question:   Preferred imaging location?    Answer:   External  . VITAMIN D 25 Hydroxy (Vit-D Deficiency, Fractures)  . Ambulatory referral to Plastic Surgery    Referral Priority:   Routine    Referral Type:   Surgical    Referral Reason:   Specialty Services Required    Requested Specialty:   Plastic Surgery    Number of Visits Requested:   1  . Ambulatory referral to Gastroenterology    Referral Priority:   Routine    Referral Type:    Consultation    Referral Reason:   Specialty Services Required    Number of Visits Requested:   1  . POCT CBC    Meds ordered this encounter  Medications  . ciprofloxacin (CIPRO) 500 MG tablet    Sig: Take 1 tablet (500 mg total) by mouth 2 (two) times daily.    Dispense:  10 tablet    Refill:  0    I personally performed the services described in this documentation, which was scribed in my presence. The recorded information has been reviewed and considered, and addended by me as needed.   [name], M.D.  Primary Care at [facility] [address] [place], [place] [address] [phone] phone [phone] fax  09/20/16 9:45 AM

## 2016-09-17 NOTE — Patient Instructions (Addendum)
IF you received an x-ray today, you will receive an invoice from [facility]. Please contact [facility] at [phone] with questions or concerns regarding your invoice.   IF you received labwork today, you will receive an invoice from [place]. Please contact [facility] at [phone] with questions or concerns regarding your invoice.   Our billing staff will not be able to assist you with questions regarding bills from these companies.  You will be contacted with the lab results as soon as they are available. The fastest way to get your results is to activate your My Chart account. Instructions are located on the last page of this paperwork. If you have not heard from [place] regarding the results in 2 weeks, please contact this office.     Abdominal Bloating When you have abdominal bloating, your abdomen may feel full, tight, or painful. It may also look bigger than normal or swollen (distended). Common causes of abdominal bloating include:  Swallowing air.  Constipation.  Problems digesting food.  Eating too much.  Irritable bowel syndrome. This is a condition that affects the large intestine.  Lactose intolerance. This is an inability to digest lactose, a natural sugar in dairy products.  Celiac disease. This is a condition that affects the ability to digest gluten, a protein found in some grains.  Gastroparesis. This is a condition that slows down the movement of food in the stomach and small intestine. It is more common in people with diabetes mellitus.  Gastroesophageal reflux disease (GERD). This is a digestive condition that makes stomach acid flow back into the esophagus.  Urinary retention. This means that the body is holding onto urine, and the bladder cannot be emptied all the way.  Follow these instructions at home: Eating and drinking  Avoid eating too much.  Try not to swallow air while talking or eating.  Avoid eating while lying  down.  Avoid these foods and drinks: ? Foods that cause gas, such as broccoli, cabbage, cauliflower, and baked beans. ? Carbonated drinks. ? Hard candy. ? Chewing gum. Medicines  Take over-the-counter and prescription medicines only as told by your health care provider.  Take probiotic medicines. These medicines contain live bacteria or yeasts that can help digestion.  Take coated peppermint oil capsules. Activity  Try to exercise regularly. Exercise may help to relieve bloating that is caused by gas and relieve constipation. General instructions  Keep all follow-up visits as told by your health care provider. This is important. Contact a health care provider if:  You have nausea and vomiting.  You have diarrhea.  You have abdominal pain.  You have unusual weight loss or weight gain.  You have severe pain, and medicines do not help. Get help right away if:  You have severe chest pain.  You have trouble breathing.  You have shortness of breath.  You have trouble urinating.  You have darker urine than normal.  You have blood in your stools or have dark, tarry stools. Summary  Abdominal bloating means that the abdomen is swollen.  Common causes of abdominal bloating are swallowing air, constipation, and problems digesting food.  Avoid eating too much and avoid swallowing air.  Avoid foods that cause gas, carbonated drinks, hard candy, and chewing gum. This information is not intended to replace advice given to you by your health care provider. Make sure you discuss any questions you have with your health care provider. Document Released: 03/26/2016 Document Revised: 03/26/2016 Document Reviewed: 03/26/2016 Elsevier Interactive  Patient Education  2018 Elsevier Inc.  High-Fiber Diet Fiber, also called dietary fiber, is a type of carbohydrate found in fruits, vegetables, whole grains, and beans. A high-fiber diet can have many health benefits. Your health care  provider may recommend a high-fiber diet to help:  Prevent constipation. Fiber can make your bowel movements more regular.  Lower your cholesterol.  Relieve hemorrhoids, uncomplicated diverticulosis, or irritable bowel syndrome.  Prevent overeating as part of a weight-loss plan.  Prevent heart disease, type 2 diabetes, and certain cancers.  What is my plan? The recommended daily intake of fiber includes:  38 grams for men under age 77.  30 grams for men over age 60.  25 grams for women under age 49.  21 grams for women over age 54.  You can get the recommended daily intake of dietary fiber by eating a variety of fruits, vegetables, grains, and beans. Your health care provider may also recommend a fiber supplement if it is not possible to get enough fiber through your diet. What do I need to know about a high-fiber diet?  Fiber supplements have not been widely studied for their effectiveness, so it is better to get fiber through food sources.  Always check the fiber content on thenutrition facts label of any prepackaged food. Look for foods that contain at least 5 grams of fiber per serving.  Ask your dietitian if you have questions about specific foods that are related to your condition, especially if those foods are not listed in the following section.  Increase your daily fiber consumption gradually. Increasing your intake of dietary fiber too quickly may cause bloating, cramping, or gas.  Drink plenty of water. Water helps you to digest fiber. What foods can I eat? Grains Whole-grain breads. Multigrain cereal. Oats and oatmeal. Brown rice. Barley. Bulgur wheat. Millet. Bran muffins. Popcorn. Rye wafer crackers. Vegetables Sweet potatoes. Spinach. Kale. Artichokes. Cabbage. Broccoli. Green peas. Carrots. Squash. Fruits Berries. Pears. Apples. Oranges. Avocados. Prunes and raisins. Dried figs. Meats and Other Protein Sources Navy, kidney, pinto, and soy beans. Split peas.  Lentils. Nuts and seeds. Dairy Fiber-fortified yogurt. Beverages Fiber-fortified soy milk. Fiber-fortified orange juice. Other Fiber bars. The items listed above may not be a complete list of recommended foods or beverages. Contact your dietitian for more options. What foods are not recommended? Grains White bread. Pasta made with refined flour. White rice. Vegetables Fried potatoes. Canned vegetables. Well-cooked vegetables. Fruits Fruit juice. Cooked, strained fruit. Meats and Other Protein Sources Fatty cuts of meat. Fried Environmental education officer or fried fish. Dairy Milk. Yogurt. Cream cheese. Sour cream. Beverages Soft drinks. Other Cakes and pastries. Butter and oils. The items listed above may not be a complete list of foods and beverages to avoid. Contact your dietitian for more information. What are some tips for including high-fiber foods in my diet?  Eat a wide variety of high-fiber foods.  Make sure that half of all grains consumed each day are whole grains.  Replace breads and cereals made from refined flour or white flour with whole-grain breads and cereals.  Replace white rice with brown rice, bulgur wheat, or millet.  Start the day with a breakfast that is high in fiber, such as a cereal that contains at least 5 grams of fiber per serving.  Use beans in place of meat in soups, salads, or pasta.  Eat high-fiber snacks, such as berries, raw vegetables, nuts, or popcorn. This information is not intended to replace advice given to you by your  health care provider. Make sure you discuss any questions you have with your health care provider. Document Released: 02/22/2005 Document Revised: 07/31/2015 Document Reviewed: 08/07/2013 Elsevier Interactive Patient Education  2017 [employer]

## 2016-09-18 LAB — VITAMIN D 25 HYDROXY (VIT D DEFICIENCY, FRACTURES): Vit D, 25-Hydroxy: 21.2 ng/mL — ABNORMAL LOW (ref 30.0–100.0)

## 2016-09-25 MED ORDER — VITAMIN D (ERGOCALCIFEROL) 1.25 MG (50000 UNIT) PO CAPS: 50000.0000 [IU] | ORAL_CAPSULE | ORAL | 3 refills | Status: DC

## 2016-09-25 NOTE — Addendum Note (Signed)
Addended by: Sherren MochaSHAW, Denaja Verhoeven N on: 09/25/2016 09:22 PM   Modules accepted: Orders

## 2016-11-05 ENCOUNTER — Ambulatory Visit: Payer: Self-pay | Admitting: Internal Medicine

## 2016-11-10 ENCOUNTER — Ambulatory Visit (INDEPENDENT_AMBULATORY_CARE_PROVIDER_SITE_OTHER): Payer: Self-pay | Admitting: Internal Medicine

## 2016-11-10 ENCOUNTER — Encounter: Payer: Self-pay | Admitting: Internal Medicine

## 2016-11-10 VITALS — BP 126/72 | HR 84 | Ht 67.0 in | Wt 259.0 lb

## 2016-11-10 DIAGNOSIS — R945 Abnormal results of liver function studies: Secondary | ICD-10-CM

## 2016-11-10 DIAGNOSIS — Z6841 Body Mass Index (BMI) 40.0 and over, adult: Secondary | ICD-10-CM

## 2016-11-10 DIAGNOSIS — R1013 Epigastric pain: Secondary | ICD-10-CM

## 2016-11-10 DIAGNOSIS — R140 Abdominal distension (gaseous): Secondary | ICD-10-CM

## 2016-11-10 DIAGNOSIS — R7989 Other specified abnormal findings of blood chemistry: Secondary | ICD-10-CM

## 2016-11-10 DIAGNOSIS — R109 Unspecified abdominal pain: Secondary | ICD-10-CM

## 2016-11-10 DIAGNOSIS — R635 Abnormal weight gain: Secondary | ICD-10-CM

## 2016-11-10 NOTE — Patient Instructions (Addendum)
You have been scheduled for an abdominal ultrasound at [facility] (1st floor of hospital) on 11/16/2016 at 10:30am. Please/ arrive 15 minutes prior to your appointment for registration. Make certain not to have anything to eat or drink 6 hours prior to your appointment. Should you need to reschedule your appointment, please contact radiology at [phone]. This test typically takes about 30 minutes to perform.  You have been scheduled for an endoscopy. Please follow written instructions given to you at your visit today. If you use inhalers (even only as needed), please bring them with you on the day of your procedure. Your physician has requested that you go to www.startemmi.com and enter the access code given to you at your visit today. This web site gives a general overview about your procedure. However, you should still follow specific instructions given to you by our office regarding your preparation for the procedure.   Please focus on exercise and weight loss per Dr. Marina GoodellPerry

## 2016-11-10 NOTE — Progress Notes (Signed)
HISTORY OF PRESENT ILLNESS:  Larry Hayden is a 31 y.o. male  , native of [place], who is referred today by his primary care provider Dr. Norberto SorensonEva Shaw with chief complaint of abdominal pain. He is accompanied by a Nurse, learning disabilitytranslator from [employer]. The patient reports intermittent symptoms of postprandial abdominal bloating for some time. Initially intermittent. More frequent over the past 2 months. He describes a pressure or swelling sensation after meals. Typically last 30 minutes. His diet is unchanged. He continues with weight gain. One to 2 normal bowel movements daily. No exercise. 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 May have some cramping discomfort in the lower abdomen. More than heavy feeling in the upper abdomen. No nausea or vomiting. No reflux symptoms. No dysphagia. Symptoms are improved with food avoidance. No nocturnal symptoms. Only medication is vitamin D. Review of outside blood work from February 2018 pints mild elevation of ALT and glucose. Low vitamin D. Normal CBC. Hemoglobin A1c of 5.9. Negative acute abdominal series July 2018.  REVIEW OF SYSTEMS:  All non-GI ROS negative unless otherwise stated in the history of present illness except for arthritis  Past Medical History:  Diagnosis Date  . Acne   . History of hypercalcemia 2015  . Hyperactive gag reflex   . Hyperparathyroidism (HCC)   . Pilonidal sinus 04/2015  . Tooth injury    right upper front - hx. of repair  . Vitamin D deficiency     Past Surgical History:  Procedure Laterality Date  . NO PAST SURGERIES    . PILONIDAL CYST EXCISION N/A 04/23/2015   Procedure: CYST EXCISION PILONIDAL EXTENSIVE;  Surgeon: Almond LintFaera Byerly, MD;  Location: [facility];  Service: General;  Laterality: N/A;    Social History Larry Hayden  reports that he quit smoking about 15 months ago. His smoking use included Cigarettes. He has a 13.00 pack-year smoking history. He has never used smokeless tobacco. He reports that he  drinks alcohol. He reports that he does not use drugs.  family history includes Cancer in his mother; Heart disease in his maternal grandmother.  No Known Allergies     PHYSICAL EXAMINATION: Vital signs: BP 126/72   Pulse 84   Ht 5\' 7"  (1.702 m)   Wt 259 lb (117.5 kg)   BMI 40.57 kg/m   Constitutional: generally well-appearing, no acute distress Psychiatric: alert and oriented x3, cooperative Eyes: extraocular movements intact, anicteric, conjunctiva pink Mouth: oral pharynx moist, no lesions Neck: suppleWithout thyromegaly Lymph: no lymphadenopathy Cardiovascular: heart regular rate and rhythm, no murmur Lungs: clear to auscultation bilaterally Abdomen: soft, Obese, nontender, nondistended, no obvious ascites, no peritoneal signs, normal bowel sounds, no organomegaly Rectal: Omitted Extremities: no clubbing cyanosis or lower extremity edema bilaterally Skin: no lesions on visible extremities Neuro: No focal deficits. Normal DTRs. Cranial nerves intact. No asterixis  ASSESSMENT:  #1. Postprandial abdominal bloating discomfort and dyspepsia without alarm features #2. Obesity #3. Prediabetes #4. Elevated ALT. Rule out fatty liver   PLAN:  #1. Exercise #2. Weight loss #3. Discussion on abdominal bloating and increased intestinal gas #4. Provided literature on intestinal gas as well as anti-gas and flatulence dietary brochure #5. Upper endoscopy to evaluate abdominal discomfort and dyspepsia. #6. Abdominal ultrasound to evaluate abdominal discomfort and elevated liver tests #7. Additional recommendations and follow-up to be determined after the above completed  A copy of this consultation note has been sent to Dr. Clelia CroftShaw

## 2016-11-16 ENCOUNTER — Ambulatory Visit (HOSPITAL_COMMUNITY)
Admission: RE | Admit: 2016-11-16 | Discharge: 2016-11-16 | Disposition: A | Discharge status: Home / Self Care | Payer: BLUE CROSS/BLUE SHIELD | Source: Ambulatory Visit | Attending: Internal Medicine | Admitting: Internal Medicine

## 2016-11-16 DIAGNOSIS — R932 Abnormal findings on diagnostic imaging of liver and biliary tract: Secondary | ICD-10-CM | POA: Diagnosis not present

## 2016-11-16 DIAGNOSIS — R109 Unspecified abdominal pain: Secondary | ICD-10-CM | POA: Insufficient documentation

## 2016-11-16 DIAGNOSIS — R140 Abdominal distension (gaseous): Secondary | ICD-10-CM | POA: Diagnosis not present

## 2016-11-16 DIAGNOSIS — R635 Abnormal weight gain: Secondary | ICD-10-CM | POA: Diagnosis not present

## 2016-11-17 ENCOUNTER — Ambulatory Visit (INDEPENDENT_AMBULATORY_CARE_PROVIDER_SITE_OTHER): Payer: BLUE CROSS/BLUE SHIELD | Admitting: Physician Assistant

## 2016-11-17 ENCOUNTER — Emergency Department (HOSPITAL_COMMUNITY): Payer: BLUE CROSS/BLUE SHIELD

## 2016-11-17 ENCOUNTER — Emergency Department (HOSPITAL_COMMUNITY)
Admission: EM | Admit: 2016-11-17 | Discharge: 2016-11-17 | Disposition: A | Discharge status: Home / Self Care | Payer: BLUE CROSS/BLUE SHIELD | Attending: Emergency Medicine | Admitting: Emergency Medicine

## 2016-11-17 ENCOUNTER — Encounter: Payer: Self-pay | Admitting: Internal Medicine

## 2016-11-17 ENCOUNTER — Telehealth: Payer: Self-pay | Admitting: Internal Medicine

## 2016-11-17 ENCOUNTER — Encounter: Payer: Self-pay | Admitting: Physician Assistant

## 2016-11-17 ENCOUNTER — Encounter (HOSPITAL_COMMUNITY): Payer: Self-pay | Admitting: *Deleted

## 2016-11-17 VITALS — BP 129/86 | HR 78 | Temp 98.6°F | Resp 18 | Ht 67.0 in | Wt 260.8 lb

## 2016-11-17 DIAGNOSIS — Z79899 Other long term (current) drug therapy: Secondary | ICD-10-CM | POA: Insufficient documentation

## 2016-11-17 DIAGNOSIS — R197 Diarrhea, unspecified: Secondary | ICD-10-CM | POA: Diagnosis not present

## 2016-11-17 DIAGNOSIS — R1110 Vomiting, unspecified: Secondary | ICD-10-CM | POA: Diagnosis not present

## 2016-11-17 DIAGNOSIS — R112 Nausea with vomiting, unspecified: Secondary | ICD-10-CM

## 2016-11-17 DIAGNOSIS — A048 Other specified bacterial intestinal infections: Secondary | ICD-10-CM

## 2016-11-17 DIAGNOSIS — R7303 Prediabetes: Secondary | ICD-10-CM | POA: Diagnosis not present

## 2016-11-17 DIAGNOSIS — Z87891 Personal history of nicotine dependence: Secondary | ICD-10-CM | POA: Diagnosis not present

## 2016-11-17 DIAGNOSIS — R1011 Right upper quadrant pain: Secondary | ICD-10-CM | POA: Insufficient documentation

## 2016-11-17 DIAGNOSIS — Z114 Encounter for screening for human immunodeficiency virus [HIV]: Secondary | ICD-10-CM | POA: Diagnosis not present

## 2016-11-17 LAB — COMPREHENSIVE METABOLIC PANEL
ALK PHOS: 75 U/L (ref 38–126)
ALT: 66 U/L — ABNORMAL HIGH (ref 17–63)
ANION GAP: 6 (ref 5–15)
AST: 30 U/L (ref 15–41)
Albumin: 4.4 g/dL (ref 3.5–5.0)
BUN: 11 mg/dL (ref 6–20)
CALCIUM: 10.8 mg/dL — AB (ref 8.9–10.3)
CHLORIDE: 104 mmol/L (ref 101–111)
CO2: 27 mmol/L (ref 22–32)
CREATININE: 0.85 mg/dL (ref 0.61–1.24)
Glucose, Bld: 115 mg/dL — ABNORMAL HIGH (ref 65–99)
Potassium: 3.7 mmol/L (ref 3.5–5.1)
SODIUM: 137 mmol/L (ref 135–145)
Total Bilirubin: 0.5 mg/dL (ref 0.3–1.2)
Total Protein: 7.4 g/dL (ref 6.5–8.1)

## 2016-11-17 LAB — LIPASE, BLOOD: LIPASE: 30 U/L (ref 11–51)

## 2016-11-17 LAB — POC MICROSCOPIC URINALYSIS (UMFC): MUCUS RE: ABSENT

## 2016-11-17 LAB — POCT CBC
GRANULOCYTE PERCENT: 61.7 % (ref 37–80)
HCT, POC: 49.5 % (ref 43.5–53.7)
HEMOGLOBIN: 16.1 g/dL (ref 14.1–18.1)
Lymph, poc: 2.5 (ref 0.6–3.4)
MCH, POC: 27.7 pg (ref 27–31.2)
MCHC: 32.5 g/dL (ref 31.8–35.4)
MCV: 85.2 fL (ref 80–97)
MID (cbc): 0.8 (ref 0–0.9)
MPV: 9.2 fL (ref 0–99.8)
PLATELET COUNT, POC: 203 10*3/uL (ref 142–424)
POC Granulocyte: 5.2 (ref 2–6.9)
POC LYMPH %: 29 % (ref 10–50)
POC MID %: 9.3 %M (ref 0–12)
RBC: 5.81 M/uL (ref 4.69–6.13)
RDW, POC: 12.7 %
WBC: 8.5 10*3/uL (ref 4.6–10.2)

## 2016-11-17 LAB — CBC
HCT: 47.2 % (ref 39.0–52.0)
HEMOGLOBIN: 16 g/dL (ref 13.0–17.0)
MCH: 27.7 pg (ref 26.0–34.0)
MCHC: 33.9 g/dL (ref 30.0–36.0)
MCV: 81.7 fL (ref 78.0–100.0)
PLATELETS: 224 10*3/uL (ref 150–400)
RBC: 5.78 MIL/uL (ref 4.22–5.81)
RDW: 13.1 % (ref 11.5–15.5)
WBC: 9.2 10*3/uL (ref 4.0–10.5)

## 2016-11-17 LAB — POCT URINALYSIS DIP (MANUAL ENTRY)
Bilirubin, UA: NEGATIVE
GLUCOSE UA: NEGATIVE mg/dL
Ketones, POC UA: NEGATIVE mg/dL
LEUKOCYTES UA: NEGATIVE
NITRITE UA: NEGATIVE
Protein Ur, POC: 30 mg/dL — AB
Spec Grav, UA: >=1.03 — AB (ref 1.010–1.025)
UROBILINOGEN UA: 0.2 U/dL
pH, UA: 6 (ref 5.0–8.0)

## 2016-11-17 LAB — POCT GLYCOSYLATED HEMOGLOBIN (HGB A1C): HEMOGLOBIN A1C: 6.2

## 2016-11-17 MED ORDER — SUCRALFATE 1 G PO TABS: 1.0000 g | ORAL_TABLET | Freq: Three times a day (TID) | ORAL | 1 refills | Status: DC

## 2016-11-17 MED ORDER — OMEPRAZOLE 20 MG PO CPDR: 20.0000 mg | DELAYED_RELEASE_CAPSULE | Freq: Every day | ORAL | 0 refills | Status: DC

## 2016-11-17 MED ORDER — GI COCKTAIL ~~LOC~~
30.0000 mL | Freq: Once | ORAL | Status: AC
  Administered 2016-11-17: 30 mL via ORAL
  Filled 2016-11-17: qty 30

## 2016-11-17 MED ORDER — SODIUM CHLORIDE 0.9 % IV BOLUS (SEPSIS)
1000.0000 mL | Freq: Once | INTRAVENOUS | Status: AC
  Administered 2016-11-17: 1000 mL via INTRAVENOUS

## 2016-11-17 MED ORDER — FENTANYL CITRATE (PF) 100 MCG/2ML IJ SOLN
INTRAMUSCULAR | Status: DC
Start: 2016-11-17 — End: 2016-11-17
  Filled 2016-11-17: qty 2

## 2016-11-17 MED ORDER — FENTANYL CITRATE (PF) 100 MCG/2ML IJ SOLN: 50.0000 ug | INTRAMUSCULAR | Status: DC | PRN

## 2016-11-17 MED ORDER — ONDANSETRON 4 MG PO TBDP: 4.0000 mg | ORAL_TABLET | Freq: Three times a day (TID) | ORAL | 0 refills | Status: DC | PRN

## 2016-11-17 MED ORDER — ONDANSETRON HCL 4 MG/2ML IJ SOLN
4.0000 mg | Freq: Once | INTRAMUSCULAR | Status: AC
  Administered 2016-11-17: 4 mg via INTRAVENOUS
  Filled 2016-11-17: qty 2

## 2016-11-17 MED ORDER — ONDANSETRON HCL 4 MG/2ML IJ SOLN
INTRAMUSCULAR | Status: AC
  Filled 2016-11-17: qty 2

## 2016-11-17 MED ORDER — PANTOPRAZOLE SODIUM 20 MG PO TBEC
20.0000 mg | DELAYED_RELEASE_TABLET | Freq: Every day | ORAL | Status: DC
  Filled 2016-11-17: qty 1

## 2016-11-17 MED ORDER — IOPAMIDOL (ISOVUE-300) INJECTION 61%
INTRAVENOUS | Status: AC
  Administered 2016-11-17: 100 mL
  Filled 2016-11-17: qty 100

## 2016-11-17 MED ORDER — ONDANSETRON 4 MG PO TBDP: 4.0000 mg | ORAL_TABLET | Freq: Once | ORAL | Status: DC

## 2016-11-17 MED ORDER — ONDANSETRON HCL 4 MG/2ML IJ SOLN
4.0000 mg | Freq: Once | INTRAMUSCULAR | Status: AC | PRN
  Administered 2016-11-17: 4 mg via INTRAVENOUS

## 2016-11-17 MED ORDER — AMBULATORY NON FORMULARY MEDICATION: 1 refills | Status: DC

## 2016-11-17 MED FILL — SUCRALFATE 1 GM TABLET: 1 | 15 days supply | Qty: 60 | Fill #0

## 2016-11-17 NOTE — Discharge Instructions (Signed)
As discussed, stay well-hydrated and take zofran as needed for nausea. Omeprazole daily and follow up with your PCP.  Start reintroducing foods slowly starting with clear broths and moving to bland foods. Avoid spicy food, alcohol, smoking.  Return if symptoms worsen, nausea, vomiting, diarrhea, pain, fever, chills or other new concerning symptoms in the meantime.

## 2016-11-17 NOTE — ED Triage Notes (Signed)
Pt is here with 2 day history of left upper quadrant pain and indigestion.  Pt reports vomiting 2 days.  Had ultrasound done yesterday and they did not think gall bladder

## 2016-11-17 NOTE — ED Provider Notes (Signed)
[facility]-EMERGENCY DEPT Provider Note   CSN: [id number] Arrival date & time: 11/17/16  1558     History   Chief Complaint Chief Complaint  Patient presents with  . Abdominal Pain    HPI Kit Mollett is a 31 y.o. male presenting with 4 days intermittent nonbloody diarrhea and right upper quadrant pain which is worse after meals and accompanied by nausea/vomiting. He was evaluated yesterday with right upper quadrant ultrasound imaging which did not reveal any cholelithiasis or wall thickening. He was seen earlier today and prescribed sucralfate and Gas-X but couldn't take the pain anymore and came to be evaluated in the emergency department. Denies any history of abdominal surgery. Denies fever, chills, dysuria, hematuria  HPI  Past Medical History:  Diagnosis Date  . Acne   . History of hypercalcemia 2015  . Hyperactive gag reflex   . Hyperparathyroidism (HCC)   . Pilonidal sinus 04/2015  . Tooth injury    right upper front - hx. of repair  . Vitamin D deficiency     Patient Active Problem List   Diagnosis Date Noted  . Prediabetes 09/17/2016  . Hyperparathyroidism (HCC) 12/17/2013  . Vitamin D deficiency 12/17/2013  . Hypercalcemia 06/04/2013  . Stiffness of hand joint 06/04/2013  . Smoking 06/04/2013  . Tobacco use disorder 05/11/2013  . Other and unspecified hyperlipidemia 03/30/2013  . Elevated BP 02/16/2013  . Acne 02/16/2013    Past Surgical History:  Procedure Laterality Date  . NO PAST SURGERIES    . PILONIDAL CYST EXCISION N/A 04/23/2015   Procedure: CYST EXCISION PILONIDAL EXTENSIVE;  Surgeon: Almond Lint, MD;  Location: [facility];  Service: General;  Laterality: N/A;       Home Medications    Prior to Admission medications   Medication Sig Start Date End Date Taking? Authorizing Provider  cholecalciferol (VITAMIN D) 1000 units tablet Take 1,000 Units by mouth daily.   Yes [provider]  AMBULATORY NON FORMULARY  MEDICATION MIX: 90 ml Cherry Mylanta, 90 ml Cherry Benadryl, 30 ml Cherry Hurricane liquid.  5-10 ml PO  Q2 hours, prn abdominal pain. Patient not taking: Reported on 11/17/2016 11/17/16   Porfirio Oar, PA-C  omeprazole (PRILOSEC) 20 MG capsule Take 1 capsule (20 mg total) by mouth daily. 11/17/16   Mathews Robinsons B, PA-C  ondansetron (ZOFRAN ODT) 4 MG disintegrating tablet Take 1 tablet (4 mg total) by mouth every 8 (eight) hours as needed for nausea or vomiting. 11/17/16   Mathews Robinsons B, PA-C  sucralfate (CARAFATE) 1 g tablet Take 1 tablet (1 g total) by mouth 4 (four) times daily -  with meals and at bedtime. Patient not taking: Reported on 11/17/2016 11/17/16   Porfirio Oar, PA-C    Family History Family History  Problem Relation Age of Onset  . Cancer Mother   . Heart disease Maternal Grandmother     Social History Social History  Substance Use Topics  . Smoking status: Former Smoker    Packs/day: 1.00    Years: 13.00    Types: Cigarettes    Quit date: 07/28/2015  . Smokeless tobacco: Never Used  . Alcohol use 0.0 oz/week     Comment: occasionally     Allergies   Patient has no known allergies.   Review of Systems Review of Systems  Constitutional: Negative for chills and fever.  HENT: Negative for congestion, ear pain and sore throat.   Respiratory: Negative for cough, choking, chest tightness, shortness of breath, wheezing and stridor.  Cardiovascular: Negative for chest pain and palpitations.  Gastrointestinal: Positive for abdominal pain, diarrhea, nausea and vomiting. Negative for abdominal distention, anal bleeding, blood in stool and constipation.  Genitourinary: Negative for difficulty urinating, discharge, dysuria, flank pain, frequency, hematuria, scrotal swelling and urgency.  Musculoskeletal: Negative for arthralgias, back pain, myalgias, neck pain and neck stiffness.  Skin: Negative for color change, pallor and rash.  Neurological: Negative for  dizziness, seizures, syncope, weakness, light-headedness and headaches.     Physical Exam Updated Vital Signs BP (!) 143/97   Pulse 64   Temp 98.2 F (36.8 C) (Oral)   Resp 20   SpO2 97%   Physical Exam  Constitutional: He appears well-developed and well-nourished. No distress.  Afebrile, nontoxic-appearing, lying in bed in mild discomfort.  HENT:  Head: Normocephalic and atraumatic.  Eyes: Conjunctivae and EOM are normal.  Neck: Neck supple.  Cardiovascular: Normal rate, regular rhythm and normal heart sounds.   No murmur heard. Pulmonary/Chest: Effort normal and breath sounds normal. No respiratory distress. He has no wheezes. He has no rales.  Abdominal: Soft. He exhibits no distension and no mass. There is tenderness. There is no rebound and no guarding.  Active bowel sounds. abdomen is supple and non-tender to both light and deep palpation except for RUQ/epigastric region. No rebound tenderness. No palpated masses. No costovertebral angle tenderness. Positive murphy's sign Negative McBurney's point tenderness   Musculoskeletal: He exhibits no edema.  Neurological: He is alert.  Skin: Skin is warm and dry. No rash noted. He is not diaphoretic. No erythema. No pallor.  Psychiatric: He has a normal mood and affect.  Nursing note and vitals reviewed.    ED Treatments / Results  Labs (all labs ordered are listed, but only abnormal results are displayed) Labs Reviewed  COMPREHENSIVE METABOLIC PANEL - Abnormal; Notable for the following:       Result Value   Glucose, Bld 115 (*)    Calcium 10.8 (*)    ALT 66 (*)    All other components within normal limits  LIPASE, BLOOD  CBC    EKG  EKG Interpretation  Date/Time:  Wednesday November 17 2016 16:04:08 EDT Ventricular Rate:  71 PR Interval:  148 QRS Duration: 100 QT Interval:  362 QTC Calculation: 393 R Axis:   81 Text Interpretation:  Normal sinus rhythm with sinus arrhythmia Normal ECG Confirmed by Benjiman Core [phone]) on 11/17/2016 8:05:16 PM       Radiology Dg Chest 2 View  Result Date: 11/17/2016 CLINICAL DATA:  Epigastric pain, right upper quadrant pain EXAM: CHEST  2 VIEW COMPARISON:  09/17/2016 FINDINGS: Heart and mediastinal contours are within normal limits. No focal opacities or effusions. No acute bony abnormality. IMPRESSION: No active cardiopulmonary disease. Electronically Signed   By: Charlett Nose M.D.   On: 11/17/2016 18:11   US Abdomen Complete  Result Date: 11/16/2016 CLINICAL DATA:  Bloating.  Abdominal pain . EXAM: ABDOMEN ULTRASOUND COMPLETE COMPARISON:  KUB 09/17/2016 . FINDINGS: Gallbladder: No gallstones or wall thickening visualized. No sonographic Murphy sign noted by sonographer. Common bile duct: Diameter: 4.1 mm Liver: Increased echogenicity consistent fatty infiltration and/or hepatocellular disease. Portal vein is patent on color Doppler imaging with normal direction of blood flow towards the liver. IVC: No abnormality visualized. Pancreas: Visualized portion unremarkable. Spleen: Size and appearance within normal limits. Right Kidney: Length: 13.0 cm. Echogenicity within normal limits. No mass or hydronephrosis visualized. Left Kidney: Length: 12.9 cm. Echogenicity within normal limits. No mass or hydronephrosis  visualized. Abdominal aorta: No aneurysm visualized. Other findings: None. IMPRESSION: There is slightly echogenic suggesting fatty infiltration and/or hepatocellular disease. No acute or focal abnormality identified. Electronically Signed   By: Maisie Fushomas  Register   On: 11/16/2016 13:58   Ct Abdomen Pelvis W Contrast  Result Date: 11/17/2016 CLINICAL DATA:  31 year old male with left upper quadrant abdominal pain and vomiting for 2 days. EXAM: CT ABDOMEN AND PELVIS WITH CONTRAST TECHNIQUE: Multidetector CT imaging of the abdomen and pelvis was performed using the standard protocol following bolus administration of intravenous contrast. CONTRAST:  100mL ISOVUE-300  IOPAMIDOL (ISOVUE-300) INJECTION 61% COMPARISON:  Abdomen ultrasound 11/16/2016. Abdominal radiographic series [id number]. FINDINGS: Lower chest: Negative.  No pericardial or pleural effusion. Hepatobiliary: Mild hepatic steatosis.  Otherwise negative. Pancreas: Negative. Spleen: The spleen appears normal. No perisplenic fluid or inflammation. Adrenals/Urinary Tract: Normal adrenal glands. Both kidneys appear normal. No left perinephric stranding, hydronephrosis, or hydroureter. Negative course of the left ureter. Unremarkable urinary bladder.  No urologic calculus identified. Stomach/Bowel: Negative rectum. The sigmoid colon is redundant, tracking into the right lower quadrant, but otherwise normal aside from retained stool. Negative left colon and splenic flexure aside from mild retained stool. Negative transverse colon. Redundancy of the hepatic flexure. Negative right colon. Normal retrocecal appendix. Negative terminal ileum. Flocculated material in the distal small bowel, but no dilated small bowel loops. Fluid-filled stomach is negative. Unremarkable duodenum. No abdominal free fluid or free air. Vascular/Lymphatic: Major arterial structures in the portal venous system are patent. No lymphadenopathy. Reproductive: Negative. Other: No pelvic free fluid. 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 No acute osseous abnormality identified. IMPRESSION: No acute or inflammatory findings in the abdomen or pelvis. Negative study aside from redundancy of the large bowel and hepatic steatosis. Electronically Signed   By: Odessa FlemingH  Hall M.D.   On: 11/17/2016 17:59    Procedures Procedures (including critical care time)  Medications Ordered in ED Medications  fentaNYL (SUBLIMAZE) injection 50 mcg (not administered)  gi cocktail (Maalox,Lidocaine,Donnatal) (not administered)  ondansetron (ZOFRAN) injection 4 mg (4 mg Intravenous Given 11/17/16 1623)  sodium chloride 0.9 % bolus  1,000 mL (1,000 mLs Intravenous New Bag/Given 11/17/16 1724)  ondansetron (ZOFRAN) injection 4 mg (4 mg Intravenous Given 11/17/16 1726)  iopamidol (ISOVUE-300) 61 % injection (100 mLs  Contrast Given 11/17/16 1740)     Initial Impression / Assessment and Plan / ED Course  I have reviewed the triage vital signs and the nursing notes.  Pertinent labs & imaging results that were available during my care of the patient were reviewed by me and considered in my medical decision making (see chart for details).    Patient presenting with persistent RUQ pain, positive murphy's sign on exam. Negative US yesterday except for fatty liver infiltrate.  CT abd pelvis without acute abnormality. cxr to evaluate for lower lobar pneumonia: negative.  Workup unremarkable. Patient improved significantly while in the ED. He requested a liquid medicine that was discussed with his PCP to coat his stomach and make it numb. Recommended GI cocktail and discussed PPI use with patient.  Given GI cocktail prior to discharge. Discharge home with symptomatic relief and close follow up with PCP.  Discussed strict return precautions and advised to return to the emergency department if experiencing any new or worsening symptoms. Instructions were understood and patient agreed with discharge plan.  Final Clinical Impressions(s) / ED Diagnoses   Final diagnoses:  Right upper quadrant abdominal pain  Nausea vomiting and diarrhea  New Prescriptions New Prescriptions   OMEPRAZOLE (PRILOSEC) 20 MG CAPSULE    Take 1 capsule (20 mg total) by mouth daily.   ONDANSETRON (ZOFRAN ODT) 4 MG DISINTEGRATING TABLET    Take 1 tablet (4 mg total) by mouth every 8 (eight) hours as needed for nausea or vomiting.     Gregary Cromer 11/17/16 2045    Benjiman Core, MD 11/18/16 [phone]

## 2016-11-17 NOTE — Progress Notes (Signed)
Patient ID: Larry Hayden, male    DOB: [birth year], 31 y.o.   MRN: [id number]  PCP: Sherren Mocha, MD  Chief Complaint  Patient presents with  . Abdominal Pain    since saturday. Patient states that its the liver area.     Subjective:   Presents for evaluation of abdominal pain. He is accompanied by his sister, who helps with communication.   This is his 4th visit here for abdominal pain, the first 08/05/2015, when he reported 1 day of pain, associated with diarrhea and increased gas. It was thought to be a viral syndrome, and symptomatic care was recommended.  He presented again 06/25/2016, with abdominal distention, increased flatulence, and loss of appetite, and early satiety. H pylori testing was negative, and he was encouraged to try probiotics. CMET revealed prediabetes, and ALT was 57.  He returned 09/17/2016 with persistent symptoms. He had experienced improvement with the probiotic (UltraFlora IB), but symptoms of bloating, fullness and pain recurred when he completed the course. Acute abdominal series was unremarkable. The probiotic was too expensive to continue long term, so he was given a course of ciprofloxacin and referred to GI for additional evaluation.  He saw Dr. Marina Goodell on 9/05, who provided information on dietary changes to reduce gas and flatulence. He was encouraged to increase his exercise and lose weight. [place] was ordered and he is to have upper endoscopy. ALT has risen to 66.  He had the [place] yesterday. GB is normal. Fatty liver infiltrate vs hepatocellular disease.  On Saturday, 9/08, the pain in the RUQ became worse, and is progressively worsening. Associated with diaphoresis, nausea, dizziness and generalized weakness. No vomiting x 2 days, believes that this is due to very little eating, due to pain every time he eats.  No burning with urination, but notes that the urine is dark yellow and more frequent than usual. Some diarrhea. Stools are dark in color. No  red blood.   Review of Systems As above.    Patient Active Problem List   Diagnosis Date Noted  . Prediabetes 09/17/2016  . Hyperparathyroidism (HCC) 12/17/2013  . Vitamin D deficiency 12/17/2013  . Hypercalcemia 06/04/2013  . Stiffness of hand joint 06/04/2013  . Smoking 06/04/2013  . Tobacco use disorder 05/11/2013  . Other and unspecified hyperlipidemia 03/30/2013  . Elevated BP 02/16/2013  . Acne 02/16/2013     Prior to Admission medications   Medication Sig Start Date End Date Taking? Authorizing Provider  cholecalciferol (VITAMIN D) 1000 units tablet Take 1,000 Units by mouth daily.   Yes [provider]     No Known Allergies     Objective:  Physical Exam  Constitutional: He is oriented to person, place, and time. He appears well-developed and well-nourished. He is active and cooperative. No distress.  BP 129/86 (BP Location: Right Arm, Patient Position: Sitting, Cuff Size: Large)   Pulse 78   Temp 98.6 F (37 C) (Oral)   Resp 18   Ht  (1.702 m)   Wt 260 lb 12.8 oz (118.3 kg)   SpO2 96%   BMI 40.85 kg/m   HENT:  Head: Normocephalic and atraumatic.  Right Ear: Hearing normal.  Left Ear: Hearing normal.  Eyes: Conjunctivae are normal. No scleral icterus.  Neck: Normal range of motion. Neck supple. No thyromegaly present.  Cardiovascular: Normal rate, regular rhythm and normal heart sounds.   Pulses:      Radial pulses are 2+ on the right side, and  2+ on the left side.  Pulmonary/Chest: Effort normal and breath sounds normal.  Abdominal: Soft. Normal appearance, normal aorta and bowel sounds are normal. He exhibits no distension, no pulsatile liver and no mass. There is no hepatosplenomegaly. There is tenderness in the right upper quadrant and epigastric area. There is no rigidity, no rebound, no guarding, no CVA tenderness, no tenderness at McBurney's point and negative Murphy's sign.  Mild referred tenderness noted with palpation in the LEFT  quadrants causing mild increase in RUQ discomfort.  Lymphadenopathy:       Head (right side): No tonsillar, no preauricular, no posterior auricular and no occipital adenopathy present.       Head (left side): No tonsillar, no preauricular, no posterior auricular and no occipital adenopathy present.    He has no cervical adenopathy.       Right: No supraclavicular adenopathy present.       Left: No supraclavicular adenopathy present.  Neurological: He is alert and oriented to person, place, and time. No sensory deficit.  Skin: Skin is warm, dry and intact. No rash noted. No cyanosis or erythema. Nails show no clubbing.  Psychiatric: He has a normal mood and affect. His speech is normal and behavior is normal.      Results for orders placed or performed in visit on 11/17/16  POCT urinalysis dipstick  Result Value Ref Range   Color, UA yellow yellow   Clarity, UA clear clear   Glucose, UA negative negative mg/dL   Bilirubin, UA negative negative   Ketones, POC UA negative negative mg/dL   Spec Grav, UA >=9.562 (A) 1.010 - 1.025   Blood, UA trace-lysed (A) negative   pH, UA 6.0 5.0 - 8.0   Protein Ur, POC =30 (A) negative mg/dL   Urobilinogen, UA 0.2 0.2 or 1.0 E.U./dL   Nitrite, UA Negative Negative   Leukocytes, UA Negative Negative  POCT Microscopic Urinalysis ([facility])  Result Value Ref Range   WBC,UR,HPF,POC None None WBC/hpf   RBC,UR,HPF,POC None None RBC/hpf   Bacteria None None, Too numerous to count   Mucus Absent Absent   Epithelial Cells, UR Per Microscopy Few (A) None, Too numerous to count cells/hpf  POCT CBC  Result Value Ref Range   WBC 8.5 4.6 - 10.2 K/uL   Lymph, poc 2.5 0.6 - 3.4   POC LYMPH PERCENT 29.0 10 - 50 %L   MID (cbc) 0.8 0 - 0.9   POC MID % 9.3 0 - 12 %M   POC Granulocyte 5.2 2 - 6.9   Granulocyte percent 61.7 37 - 80 %G   RBC 5.81 4.69 - 6.13 M/uL   Hemoglobin 16.1 14.1 - 18.1 g/dL   HCT, POC 13.0 86.5 - 53.7 %   MCV 85.2 80 - 97 fL   MCH, POC  27.7 27 - 31.2 pg   MCHC 32.5 31.8 - 35.4 g/dL   RDW, POC 78.4 %   Platelet Count, POC 203 142 - 424 K/uL   MPV 9.2 0 - 99.8 fL  POCT glycosylated hemoglobin (Hb A1C)  Result Value Ref Range   Hemoglobin A1C 6.2         Assessment & Plan:   1. Right upper quadrant abdominal pain Proceed with upper endoscopy with Dr. Marina Goodell, though it is not until 11/09. Trial of carafate. Trial of MMW.  - sucralfate (CARAFATE) 1 g tablet; Take 1 tablet (1 g total) by mouth 4 (four) times daily -  with meals and at bedtime. (  Patient not taking: Reported on 11/17/2016)  Dispense: 60 tablet; Refill: 1 - AMBULATORY NON FORMULARY MEDICATION; MIX: 90 ml Cherry Mylanta, 90 ml Cherry Benadryl, 30 ml Cherry Hurricane liquid. 5-10 ml PO  Q2 hours, prn abdominal pain. (Patient not taking: Reported on 11/17/2016)  Dispense: 210 mL; Refill: 1  2. Nausea and vomiting, intractability of vomiting not specified, unspecified vomiting type Update labs. See #1. - POCT urinalysis dipstick - POCT Microscopic Urinalysis ([facility]) - H. pylori breath test - POCT CBC - Comprehensive metabolic panel - TSH  3. Prediabetes Healthy eating choices and exercise, though this may be especially difficult until the abdominal pain is resolved. - POCT glycosylated hemoglobin (Hb A1C) - Comprehensive metabolic panel  4. Screening for HIV (human immunodeficiency virus) - HIV antibody    Return if symptoms worsen or fail to improve.   Fernande Brashelle S. Hayla Hinger, PA-C [facility] at [facility]

## 2016-11-17 NOTE — Telephone Encounter (Signed)
See pt advice request. 

## 2016-11-17 NOTE — Patient Instructions (Addendum)
   IF you received an x-ray today, you will receive an invoice from [facility]. Please contact [facility] at [phone] with questions or concerns regarding your invoice.   IF you received labwork today, you will receive an invoice from [facility]. Please contact [facility] at [phone] with questions or concerns regarding your invoice.   Our billing staff will not be able to assist you with questions regarding bills from these companies.  You will be contacted with the lab results as soon as they are available. The fastest way to get your results is to activate your My Chart account. Instructions are located on the last page of this paperwork. If you have not heard from us regarding the results in 2 weeks, please contact this office.     Prediabetes Prediabetes is the condition of having a blood sugar (blood glucose) level that is higher than it should be, but not high enough for you to be diagnosed with type 2 diabetes. Having prediabetes puts you at risk for developing type 2 diabetes (type 2 diabetes mellitus). Prediabetes may be called impaired glucose tolerance or impaired fasting glucose. Prediabetes usually does not cause symptoms. Your health care provider can diagnose this condition with blood tests. You may be tested for prediabetes if you are overweight and if you have at least one other risk factor for prediabetes. Risk factors for prediabetes include:  Having a family member with type 2 diabetes.  Being overweight or obese.  Being older than age 45.  Being of American-Indian, African-American, Hispanic/Latino, or Asian/Pacific Islander descent.  Having an inactive (sedentary) lifestyle.  Having a history of gestational diabetes or polycystic ovarian syndrome (PCOS).  Having low levels of good cholesterol (HDL-C) or high levels of blood fats (triglycerides).  Having high blood pressure.  What is blood glucose and how is blood glucose  measured?  Blood glucose refers to the amount of glucose in your bloodstream. Glucose comes from eating foods that contain sugars and starches (carbohydrates) that the body breaks down into glucose. Your blood glucose level may be measured in mg/dL (milligrams per deciliter) or mmol/L (millimoles per liter).Your blood glucose may be checked with one or more of the following blood tests:  A fasting blood glucose (FBG) test. You will not be allowed to eat (you will fast) for at least 8 hours before a blood sample is taken. ? A normal range for FBG is 70-100 mg/dl (3.9-5.6 mmol/L).  An A1c (hemoglobin A1c) blood test. This test provides information about blood glucose control over the previous 2?3months.  An oral glucose tolerance test (OGTT). This test measures your blood glucose twice: ? After fasting. This is your baseline level. ? Two hours after you drink a beverage that contains glucose.  You may be diagnosed with prediabetes:  If your FBG is 100?125 mg/dL (5.6-6.9 mmol/L).  If your A1c level is 5.7?6.4%.  If your OGGT result is 140?199 mg/dL (7.8-11 mmol/L).  These blood tests may be repeated to confirm your diagnosis. What happens if blood glucose is too high? The pancreas produces a hormone (insulin) that helps move glucose from the bloodstream into cells. When cells in the body do not respond properly to insulin that the body makes (insulin resistance), excess glucose builds up in the blood instead of going into cells. As a result, high blood glucose (hyperglycemia) can develop, which can cause many complications. This is a symptom of prediabetes. What can happen if blood glucose stays higher than normal for a long time?   Having high blood glucose for a long time is dangerous. Too much glucose in your blood can damage your nerves and blood vessels. Long-term damage can lead to complications from diabetes, which may include:  Heart disease.  Stroke.  Blindness.  Kidney  disease.  Depression.  Poor circulation in the feet and legs, which could lead to surgical removal (amputation) in severe cases.  How can prediabetes be prevented from turning into type 2 diabetes?  To help prevent type 2 diabetes, take the following actions:  Be physically active. ? Do moderate-intensity physical activity for at least 30 minutes on at least 5 days of the week, or as much as told by your health care provider. This could be brisk walking, biking, or water aerobics. ? Ask your health care provider what activities are safe for you. A mix of physical activities may be best, such as walking, swimming, cycling, and strength training.  Lose weight as told by your health care provider. ? Losing 5-7% of your body weight can reverse insulin resistance. ? Your health care provider can determine how much weight loss is best for you and can help you lose weight safely.  Follow a healthy meal plan. This includes eating lean proteins, complex carbohydrates, fresh fruits and vegetables, low-fat dairy products, and healthy fats. ? Follow instructions from your health care provider about eating or drinking restrictions. ? Make an appointment to see a diet and nutrition specialist (registered dietitian) to help you create a healthy eating plan that is right for you.  Do not smoke or use any tobacco products, such as cigarettes, chewing tobacco, and e-cigarettes. If you need help quitting, ask your health care provider.  Take over-the-counter and prescription medicines as told by your health care provider. You may be prescribed medicines that help lower the risk of type 2 diabetes.  This information is not intended to replace advice given to you by your health care provider. Make sure you discuss any questions you have with your health care provider. Document Released: 06/16/2015 Document Revised: 07/31/2015 Document Reviewed: 04/15/2015 Elsevier Interactive Patient Education  2018 Else[employer]  Inc[employer]  Prediabetes Eating Plan Prediabetes-also called impaired glucose tolerance or impaired fasting glucose-is a condition that causes blood sugar (blood glucose) levels to be higher than normal. Following a healthy diet can help to keep prediabetes under control. It can also help to lower the risk of type 2 diabetes and heart disease, which are increased in people who have prediabetes. Along with regular exercise, a healthy diet:  Promotes weight loss.  Helps to control blood sugar levels.  Helps to improve the way that the body uses insulin.  What do I need to know about this eating plan?  Use the glycemic index (GI) to plan your meals. The index tells you how quickly a food will raise your blood sugar. Choose low-GI foods. These foods take a longer time to raise blood sugar.  Pay close attention to the amount of carbohydrates in the food that you eat. Carbohydrates increase blood sugar levels.  Keep track of how many calories you take in. Eating the right amount of calories will help you to achieve a healthy weight. Losing about 7 percent of your starting weight can help to prevent type 2 diabetes.  You may want to follow a Mediterranean diet. This diet includes a lot of vegetables, lean meats or fish, whole grains, fruits, and healthy oils and fats. What foods can I eat? Grains Whole grains, such as whole-wheat or whole-grain   breads, crackers, cereals, and pasta. Unsweetened oatmeal. Bulgur. Barley. Quinoa. Brown rice. Corn or whole-wheat flour tortillas or taco shells. Vegetables Lettuce. Spinach. Peas. Beets. Cauliflower. Cabbage. Broccoli. Carrots. Tomatoes. Squash. Eggplant. Herbs. Peppers. Onions. Cucumbers. Brussels sprouts. Fruits Berries. Bananas. Apples. Oranges. Grapes. Papaya. Mango. Pomegranate. Kiwi. Grapefruit. Cherries. Meats and Other Protein Sources Seafood. Lean meats, such as chicken and turkey or lean cuts of pork and beef. Tofu. Eggs. Nuts. Beans. Dairy Low-fat  or fat-free dairy products, such as yogurt, cottage cheese, and cheese. Beverages Water. Tea. Coffee. Sugar-free or diet soda. Seltzer water. Milk. Milk alternatives, such as soy or almond milk. Condiments Mustard. Relish. Low-fat, low-sugar ketchup. Low-fat, low-sugar barbecue sauce. Low-fat or fat-free mayonnaise. Sweets and Desserts Sugar-free or low-fat pudding. Sugar-free or low-fat ice cream and other frozen treats. Fats and Oils Avocado. Walnuts. Olive oil. The items listed above may not be a complete list of recommended foods or beverages. Contact your dietitian for more options. What foods are not recommended? Grains Refined white flour and flour products, such as bread, pasta, snack foods, and cereals. Beverages Sweetened drinks, such as sweet iced tea and soda. Sweets and Desserts Baked goods, such as cake, cupcakes, pastries, cookies, and cheesecake. The items listed above may not be a complete list of foods and beverages to avoid. Contact your dietitian for more information. This information is not intended to replace advice given to you by your health care provider. Make sure you discuss any questions you have with your health care provider. Document Released: 07/09/2014 Document Revised: 07/31/2015 Document Reviewed: 03/20/2014 Elsevier Interactive Patient Education  2017 Elsevier Inc.  

## 2016-11-18 LAB — COMPREHENSIVE METABOLIC PANEL
A/G RATIO: 1.9 (ref 1.2–2.2)
ALK PHOS: 77 IU/L (ref 39–117)
ALT: 63 IU/L — AB (ref 0–44)
AST: 28 IU/L (ref 0–40)
Albumin: 4.6 g/dL (ref 3.5–5.5)
BILIRUBIN TOTAL: 0.2 mg/dL (ref 0.0–1.2)
BUN/Creatinine Ratio: 19 (ref 9–20)
BUN: 13 mg/dL (ref 6–20)
CHLORIDE: 104 mmol/L (ref 96–106)
CO2: 18 mmol/L — ABNORMAL LOW (ref 20–29)
Calcium: 10.7 mg/dL — ABNORMAL HIGH (ref 8.7–10.2)
Creatinine, Ser: 0.68 mg/dL — ABNORMAL LOW (ref 0.76–1.27)
GFR calc Af Amer: 146 mL/min/{1.73_m2} (ref >59)
GFR calc non Af Amer: 126 mL/min/{1.73_m2} (ref >59)
GLUCOSE: 115 mg/dL — AB (ref 65–99)
Globulin, Total: 2.4 g/dL (ref 1.5–4.5)
POTASSIUM: 4.1 mmol/L (ref 3.5–5.2)
Sodium: 139 mmol/L (ref 134–144)
Total Protein: 7 g/dL (ref 6.0–8.5)

## 2016-11-18 LAB — TSH: TSH: 1.66 u[IU]/mL (ref 0.450–4.500)

## 2016-11-18 LAB — HIV ANTIBODY (ROUTINE TESTING W REFLEX): HIV Screen 4th Generation wRfx: NONREACTIVE

## 2016-11-19 DIAGNOSIS — Z6834 Body mass index (BMI) 34.0-34.9, adult: Secondary | ICD-10-CM | POA: Insufficient documentation

## 2016-11-19 LAB — H. PYLORI BREATH TEST: H PYLORI BREATH TEST: POSITIVE — AB

## 2016-11-19 MED ORDER — AMOXICILLIN 500 MG PO CAPS: 500.0000 mg | ORAL_CAPSULE | Freq: Two times a day (BID) | ORAL | 0 refills | Status: DC

## 2016-11-19 MED ORDER — CLARITHROMYCIN 500 MG PO TABS: 500.0000 mg | ORAL_TABLET | Freq: Two times a day (BID) | ORAL | 0 refills | Status: DC

## 2016-11-19 MED ORDER — PANTOPRAZOLE SODIUM 40 MG PO TBEC: 40.0000 mg | DELAYED_RELEASE_TABLET | Freq: Two times a day (BID) | ORAL | 0 refills | Status: DC

## 2016-11-19 NOTE — Addendum Note (Signed)
Addended by: Porfirio Oar S on: 11/19/2016 06:00 PM   Modules accepted: Orders

## 2016-11-19 NOTE — Assessment & Plan Note (Signed)
COunseled on the importance of health yeating choices and regular exercise, problematic presently due to the abdominal pain.

## 2016-11-24 ENCOUNTER — Encounter: Payer: Self-pay | Admitting: Family Medicine

## 2016-11-24 ENCOUNTER — Ambulatory Visit (INDEPENDENT_AMBULATORY_CARE_PROVIDER_SITE_OTHER): Payer: BLUE CROSS/BLUE SHIELD | Admitting: Family Medicine

## 2016-11-24 VITALS — BP 128/87 | HR 84 | Temp 98.1°F | Resp 18 | Ht 67.0 in | Wt 253.0 lb

## 2016-11-24 DIAGNOSIS — Z23 Encounter for immunization: Secondary | ICD-10-CM | POA: Diagnosis not present

## 2016-11-24 DIAGNOSIS — R1011 Right upper quadrant pain: Secondary | ICD-10-CM

## 2016-11-24 DIAGNOSIS — G8929 Other chronic pain: Secondary | ICD-10-CM

## 2016-11-24 DIAGNOSIS — K2950 Unspecified chronic gastritis without bleeding: Secondary | ICD-10-CM | POA: Diagnosis not present

## 2016-11-24 DIAGNOSIS — E559 Vitamin D deficiency, unspecified: Secondary | ICD-10-CM | POA: Diagnosis not present

## 2016-11-24 DIAGNOSIS — B9681 Helicobacter pylori [H. pylori] as the cause of diseases classified elsewhere: Secondary | ICD-10-CM | POA: Diagnosis not present

## 2016-11-24 DIAGNOSIS — R1013 Epigastric pain: Secondary | ICD-10-CM

## 2016-11-24 DIAGNOSIS — A048 Other specified bacterial intestinal infections: Secondary | ICD-10-CM | POA: Diagnosis not present

## 2016-11-24 DIAGNOSIS — K760 Fatty (change of) liver, not elsewhere classified: Secondary | ICD-10-CM

## 2016-11-24 LAB — POCT URINALYSIS DIP (MANUAL ENTRY)
Bilirubin, UA: NEGATIVE
GLUCOSE UA: NEGATIVE mg/dL
Ketones, POC UA: NEGATIVE mg/dL
LEUKOCYTES UA: NEGATIVE
NITRITE UA: NEGATIVE
Protein Ur, POC: 30 mg/dL — AB
Spec Grav, UA: 1.025 (ref 1.010–1.025)
UROBILINOGEN UA: 0.2 U/dL
pH, UA: 7 (ref 5.0–8.0)

## 2016-11-24 LAB — POC MICROSCOPIC URINALYSIS (UMFC): MUCUS RE: ABSENT

## 2016-11-24 MED ORDER — OMEPRAZOLE 40 MG PO CPDR: 40.0000 mg | DELAYED_RELEASE_CAPSULE | Freq: Two times a day (BID) | ORAL | 0 refills | Status: DC

## 2016-11-24 MED ORDER — AMOXICILLIN 500 MG PO CAPS: 500.0000 mg | ORAL_CAPSULE | Freq: Two times a day (BID) | ORAL | 0 refills | Status: DC

## 2016-11-24 MED ORDER — AMOXICILLIN 500 MG PO CAPS: 500.0000 mg | ORAL_CAPSULE | Freq: Two times a day (BID) | ORAL | 0 refills | Status: AC

## 2016-11-24 MED ORDER — CLARITHROMYCIN 500 MG PO TABS: 500.0000 mg | ORAL_TABLET | Freq: Two times a day (BID) | ORAL | 0 refills | Status: DC

## 2016-11-24 MED ORDER — VITAMIN D3 125 MCG (5000 UT) PO CAPS: 5000.0000 [IU] | ORAL_CAPSULE | Freq: Every day | ORAL | 1 refills | Status: DC

## 2016-11-24 MED ORDER — CLARITHROMYCIN 500 MG PO TABS: 500.0000 mg | ORAL_TABLET | Freq: Two times a day (BID) | ORAL | 0 refills | Status: AC

## 2016-11-24 NOTE — Patient Instructions (Addendum)
IF you received an x-ray today, you will receive an invoice from [facility]. Please contact [facility] at [phone] with questions or concerns regarding your invoice.   IF you received labwork today, you will receive an invoice from [facility]. Please contact [facility] at [phone] with questions or concerns regarding your invoice.   Our billing staff will not be able to assist you with questions regarding bills from these companies.  You will be contacted with the lab results as soon as they are available. The fastest way to get your results is to activate your My Chart account. Instructions are located on the last page of this paperwork. If you have not heard from [place] regarding the results in 2 weeks, please contact this office.      Helicobacter Pylori Infection Helicobacter pylori infection is an infection in the stomach that is caused by the Helicobacter pylori (H. pylori) bacteria. This type of bacteria often lives in the lining of the stomach. The infection can cause ulcers and irritation (gastritis) in some people. It is the most common cause of ulcers in the stomach (gastric ulcer) and in the upper part of the intestine (duodenal ulcer). Having this infection may also increase the risk of stomach cancer and a type of white blood cell cancer (lymphoma) that affects the stomach. What are the causes? H. pylori is a type of bacteria that is often found in the stomachs of healthy people. The bacteria may be passed from person to person through contact with stool or saliva. It is not known why some people develop ulcers, gastritis, or cancer from the infection. What increases the risk? This condition is more likely to develop in people who:  Have family members with the infection.  Live with many other people, such as in a dormitory.  Are of African, Hispanic, or Asian descent.  What are the signs or symptoms? Most people with this infection do not have  symptoms. If you do have symptoms, they may include:  Heartburn.  Stomach pain.  Nausea.  Vomiting.  Blood-tinged vomit.  Loss of appetite.  Bad breath.  How is this diagnosed? This condition may be diagnosed based on your symptoms, a physical exam, and various tests. Tests may include:  Blood tests or stool tests to check for the proteins (antibodies) that your body may produce in response to the bacteria. These tests are the best way to confirm the diagnosis.  A breath test to check for the type of gas that the H. pylori bacteria release after breaking down a substance called urea. For the test, you are asked to drink urea. This test is often done after treatment in order to find out if the treatment worked.  A procedure in which a thin, flexible tube with a tiny camera at the end is placed into your stomach and upper intestine (upper endoscopy). Your health care provider may also take tissue samples (biopsy) to test for H. pylori and cancer.  How is this treated? Treatment for this condition usually involves taking a combination of medicines (triple therapy) for a couple of weeks. Triple therapy includes one medicine to reduce the acid in your stomach and two types of antibiotic medicines. Many drug combinations have been approved for treatment. Treatment usually kills the H. pylori and reduces your risk of cancer. You may need to be tested for H. pylori again after treatment. In some cases, the treatment may need to be repeated. Follow these instructions at home:  Take  over-the-counter and prescription medicines only as told by your health care provider.  Take your antibiotics as told by your health care provider. Do not stop taking the antibiotics even if you start to feel better.  You can do all your usual activities and eat what you usually do.  Take steps to prevent future infections: ? Wash your hands often. ? Make sure the food you eat has been properly  prepared. ? Drink water only from clean sources.  Keep all follow-up visits as told by your health care provider. This is important. Contact a health care provider if:  Your symptoms do not get better.  Your symptoms return after treatment. This information is not intended to replace advice given to you by your health care provider. Make sure you discuss any questions you have with your health care provider. Document Released: 06/16/2015 Document Revised: 07/31/2015 Document Reviewed: 03/06/2014 Elsevier Interactive Patient Education  2018 [employer] Inc.   Fatty Liver Fatty liver, also called hepatic steatosis or steatohepatitis, is a condition in which too much fat has built up in your liver cells. The liver removes harmful substances from your bloodstream. It produces fluids your body needs. It also helps your body use and store energy from the food you eat. In many cases, fatty liver does not cause symptoms or problems. It is often diagnosed when tests are being done for other reasons. However, over time, fatty liver can cause inflammation that may lead to more serious liver problems, such as scarring of the liver (cirrhosis). What are the causes? Causes of fatty liver may include:  Drinking too much alcohol.  Poor nutrition.  Obesity.  Cushing syndrome.  Diabetes.  Hyperlipidemia.  Pregnancy.  Certain drugs.  Poisons.  Some viral infections.  What increases the risk? You may be more likely to develop fatty liver if you:  Abuse alcohol.  Are pregnant.  Are overweight.  Have diabetes.  Have hepatitis.  Have a high triglyceride level.  What are the signs or symptoms? Fatty liver often does not cause any symptoms. In cases where symptoms develop, they can include:  Fatigue.  Weakness.  Weight loss.  Confusion.  Abdominal pain.  Yellowing of your skin and the white parts of your eyes (jaundice).  Nausea and vomiting.  How is this  diagnosed? Fatty liver may be diagnosed by:  Physical exam and medical history.  Blood tests.  Imaging tests, such as an ultrasound, CT scan, or MRI.  Liver biopsy. A small sample of liver tissue is removed using a needle. The sample is then looked at under a microscope.  How is this treated? Fatty liver is often caused by other health conditions. Treatment for fatty liver may involve medicines and lifestyle changes to manage conditions such as:  Alcoholism.  High cholesterol.  Diabetes.  Being overweight or obese.  Follow these instructions at home:  Eat a healthy diet as directed by your health care provider.  Exercise regularly. This can help you lose weight and control your cholesterol and diabetes. Talk to your health care provider about an exercise plan and which activities are best for you.  Do not drink alcohol.  Take medicines only as directed by your health care provider. Contact a health care provider if: You have difficulty controlling your:  Blood sugar.  Cholesterol.  Alcohol consumption.  Get help right away if:  You have abdominal pain.  You have jaundice.  You have nausea and vomiting. This information is not intended to replace advice given  to you by your health care provider. Make sure you discuss any questions you have with your health care provider. Document Released: 04/09/2005 Document Revised: 07/31/2015 Document Reviewed: 07/04/2013 Elsevier Interactive Patient Education  [employer].

## 2016-11-24 NOTE — Progress Notes (Addendum)
Subjective:  By signing my name below, I, Larry Hayden, attest that this documentation has been prepared under the direction and in the presence of Larry Sorenson, MD Electronically Signed: Charline Bills, ED Scribe 11/24/2016 at 8:34 AM.   Patient ID: Larry Hayden, male    DOB: [birth year], 31 y.o.   MRN: [id number]  Chief Complaint  Patient presents with  . Abdominal Pain  . Referral    wants endoscopy    HPI [name] is a 31 y.o. male who presents to Primary Care at [facility]. Pt has been complaining of symptoms for over 1.5 years with associated symptoms of bloating, diarrhea and increased gas, early satiety with decreased appetite. First present 08/05/15. Symptoms have been progressing without etiology found on labs included negative H.pylori breath test or XR imaging so was referred to GI. Symptoms did improve some on ultra fluor probiotic but too expensive to continue. Symptoms recurred after sensation. Irregular shift work schedule with long hours so eating patterns times are irregular but tries not to eat late at night. Increased fruit intake which still triggers symptoms. Encouraged pt to start fiber supplement, completed trial of Cipro antibiotic. 2 weeks ago, saw Dr. Marina Goodell with GI who advised abdominal US to evaluate fatty liver, weight loss through exercise and reccommended upper endoscopy which was unable to be done until November. Symptoms progressed to where he can't eat, sleep or drink so was seen by my partner Larry Hayden 1 week ago. Having associated symptoms of diaphoresis, nausea, dizziness and weakness with severe pain with eating and some dark colored diarrhea and dark urine. She started pt on Carafate and Magic mouth wash containing Mylanta, benadryl and hurricane liquid in a 3:1 ratio. However symptoms worsened and same day he went to the ER with RUQ and positive Murphy's sign. CT of abdomen/pelvis and CXR revealed no acute abnormalities, labs unremarkable with mild LFTs  elevated, slightly worsening. So given GI cocktail and reccommended PPI use as well as Zofran. H.pylori test repeated 9/12 was now positive so pt was started on amoxicillin 500 bid and clarithromycin 500 bid x 2 weeks which pt is not taking.   Hypercalcemia persists. Vitamin D still low 2 months prior at 21 so reccommended to start on high dose once weekly 50 K supplement x 1 year. Seen by Dr. Elvera Lennox for possible hyperparathyroidism 2.5 years prior. 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 However she did want Vitamin D to be repeated and then urine collection for 24 hours calcium recheck.   Today, pt states that he is doing better but he is still having some epigastric abdominal pain. States he has only been drinking water. Denies nausea and has not needed Zofran. Pt states that he has not been taking amoxicillin but has been taking 2000 units of Vitamin D daily.   Past Medical History:  Diagnosis Date  . Acne   . History of hypercalcemia 2015  . Hyperactive gag reflex   . Hyperparathyroidism (HCC)   . Pilonidal sinus 04/2015  . Tooth injury    right upper front - hx. of repair  . Vitamin D deficiency    Current Outpatient Prescriptions on File Prior to Visit  Medication Sig Dispense Refill  . AMBULATORY NON FORMULARY MEDICATION MIX: 90 ml Cherry Mylanta, 90 ml Cherry Benadryl, 30 ml Cherry Hurricane liquid.  5-10 ml PO  Q2 hours, prn abdominal pain. 210 mL 1  . amoxicillin (AMOXIL) 500 MG capsule Take 1 capsule (500 mg total) by mouth 2 (two) times  daily. 28 capsule 0  . cholecalciferol (VITAMIN D) 1000 units tablet Take 1,000 Units by mouth daily.    Marland Kitchen omeprazole (PRILOSEC) 20 MG capsule Take 1 capsule (20 mg total) by mouth daily. 30 capsule 0  . ondansetron (ZOFRAN ODT) 4 MG disintegrating tablet Take 1 tablet (4 mg total) by mouth every 8 (eight) hours as needed for nausea or vomiting. 20 tablet 0  . sucralfate (CARAFATE) 1 g tablet Take 1 tablet (1 g total) by mouth 4  (four) times daily -  with meals and at bedtime. 60 tablet 1   No current facility-administered medications on file prior to visit.    No Known Allergies    Past Surgical History:  Procedure Laterality Date  . NO PAST SURGERIES    . PILONIDAL CYST EXCISION N/A 04/23/2015   Procedure: CYST EXCISION PILONIDAL EXTENSIVE;  Surgeon: Almond Lint, MD;  Location: [facility];  Service: General;  Laterality: N/A;   Family History  Problem Relation Age of Onset  . Cancer Mother   . Heart disease Maternal Grandmother    Social History   Social History  . Marital status: Married    Spouse name: N/A  . Number of children: 0  . Years of education: College   Occupational History  . gas station attendant Larry Hayden   Social History Main Topics  . Smoking status: Former Smoker    Packs/day: 1.00    Years: 13.00    Types: Cigarettes    Quit date: 07/28/2015  . Smokeless tobacco: Never Used  . Alcohol use 0.0 oz/week     Comment: occasionally  . Drug use: No  . Sexual activity: Not Asked   Other Topics Concern  . None   Social History Narrative   Originally from [place].   Came to the [place] in 2014.   Has a degree in Public relations account executive, and served in the [employer].   Married summer 2018. His wife lives in [place].   Lives with his sister.      Depression screen [facility] 2/9 11/24/2016 11/17/2016 06/25/2016 08/05/2015 01/21/2015  Decreased Interest 0 0 0 0 0  Down, Depressed, Hopeless 0 0 0 0 0  PHQ - 2 Score 0 0 0 0 0  Altered sleeping - - - - 0  Tired, decreased energy - - - - 0  Change in appetite - - - - 0  Feeling bad or failure about yourself  - - - - 0  Trouble concentrating - - - - 0  Moving slowly or fidgety/restless - - - - 0  Suicidal thoughts - - - - 0  PHQ-9 Score - - - - 0    Review of Systems  Constitutional: Positive for appetite change, diaphoresis, fatigue and unexpected weight change (gain). Negative for activity change, chills and fever.    Gastrointestinal: Positive for abdominal distention and abdominal pain. Negative for anal bleeding, blood in stool, constipation, diarrhea, nausea and vomiting.  Endocrine: Negative for polydipsia, polyphagia and polyuria.  Genitourinary: Negative for decreased urine volume, difficulty urinating, dysuria, frequency, genital sores and hematuria.  Skin: Negative for color change.  Allergic/Immunologic: Negative for immunocompromised state.  Hematological: Negative for adenopathy.      Objective:   Physical Exam  Constitutional: He is oriented to person, place, and time. He appears well-developed and well-nourished. No distress.  HENT:  Head: Normocephalic and atraumatic.  Eyes: Conjunctivae and EOM are normal.  Neck: Neck supple. No tracheal deviation present.  Cardiovascular:  Normal rate, regular rhythm, S1 normal, S2 normal and normal heart sounds.   Pulses:      Dorsalis pedis pulses are 2+ on the right side, and 2+ on the left side.  Pulmonary/Chest: Effort normal. No respiratory distress.  Abdominal: Bowel sounds are normal. There is tenderness in the right upper quadrant, epigastric area and suprapubic area. There is positive Murphy's sign.  Musculoskeletal: Normal range of motion. He exhibits no edema.  Neurological: He is alert and oriented to person, place, and time.  Skin: Skin is warm and dry.  Psychiatric: He has a normal mood and affect. His behavior is normal.  Nursing note and vitals reviewed.  BP 128/87   Pulse 84   Temp 98.1 F (36.7 C) (Oral)   Resp 18   Ht  (1.702 m)   Wt 253 lb (114.8 kg)   SpO2 98%   BMI 39.63 kg/m     Results for orders placed or performed in visit on 11/24/16  POCT urinalysis dipstick  Result Value Ref Range   Color, UA yellow yellow   Clarity, UA clear clear   Glucose, UA negative negative mg/dL   Bilirubin, UA negative negative   Ketones, POC UA negative negative mg/dL   Spec Grav, UA 1.610 9.604 - 1.025   Blood, UA  trace-intact (A) negative   pH, UA 7.0 5.0 - 8.0   Protein Ur, POC =30 (A) negative mg/dL   Urobilinogen, UA 0.2 0.2 or 1.0 E.U./dL   Nitrite, UA Negative Negative   Leukocytes, UA Negative Negative  POCT Microscopic Urinalysis ([facility])  Result Value Ref Range   WBC,UR,HPF,POC None None WBC/hpf   RBC,UR,HPF,POC None None RBC/hpf   Bacteria Few (A) None, Too numerous to count   Mucus Absent Absent   Epithelial Cells, UR Per Microscopy Few (A) None, Too numerous to count cells/hpf    Assessment & Plan:   1. Right upper quadrant abdominal pain   2. Abdominal pain, chronic, epigastric - suspect due to new untreated dx of H. Pylori. Normal CT but if sxs cont, consider poss sx of hypercalcemia or cons HIDA scan to look at gallbladder fxn (normal appearance on CT).  3. Helicobacter pylori gastritis (chronic gastritis)   4. Fatty liver - working on low fat diet and exericse  5. Vitamin D deficiency - reviewed importance of vit D supp - need to get up to normal so we can recheck calcium and PTH as if abd pain sxs do not resolve this could be the abd pain sequelae of hypercalcemia (unlikley)  6. Hypercalcemia - saw Dr. Elvera Lennox prior, thought to be familial and benign but cannot be definitely sure until vit D is replaced and tests repeated inc 24 hr urine.  7. H. pylori infection - pt had never started treatment (much confusion w/ language barrier) so resent in and understands importance of completing entire 2 wks course of amox/biaxin with bid ppi - I have great hope that this will solve his abd pain. However, if not, will refer pt to see Dr. Loreta Ave who is sister sees - if they are unable to see pt in timely manner, rec [facility].    Orders Placed This Encounter  Procedures  . Ambulatory referral to Gastroenterology    Referral Priority:   Routine    Referral Type:   Consultation    Referral Reason:   Specialty Services Required    Number of Visits Requested:   1  . POCT urinalysis dipstick  .  POCT  Microscopic Urinalysis ([facility])    Meds ordered this encounter  Medications  . DISCONTD: clarithromycin (BIAXIN) 500 MG tablet    Sig: Take 1 tablet (500 mg total) by mouth 2 (two) times daily.    Dispense:  28 tablet    Refill:  0  . DISCONTD: amoxicillin (AMOXIL) 500 MG capsule    Sig: Take 1 capsule (500 mg total) by mouth 2 (two) times daily.    Dispense:  28 capsule    Refill:  0  . amoxicillin (AMOXIL) 500 MG capsule    Sig: Take 1 capsule (500 mg total) by mouth 2 (two) times daily.    Dispense:  28 capsule    Refill:  0  . clarithromycin (BIAXIN) 500 MG tablet    Sig: Take 1 tablet (500 mg total) by mouth 2 (two) times daily.    Dispense:  28 tablet    Refill:  0  . omeprazole (PRILOSEC) 40 MG capsule    Sig: Take 1 capsule (40 mg total) by mouth 2 (two) times daily before a meal.    Dispense:  60 capsule    Refill:  0  . Cholecalciferol (VITAMIN D3) 5000 units CAPS    Sig: Take 1 capsule (5,000 Units total) by mouth daily.    Dispense:  90 capsule    Refill:  1    I personally performed the services described in this documentation, which was scribed in my presence. The recorded information has been reviewed and considered, and addended by me as needed.   Larry Hayden, M.D.  Primary Care at [facility] [address] [place], [place] [address] [phone] phone [phone] fax  11/27/16 12:33 AM

## 2016-12-07 DIAGNOSIS — L738 Other specified follicular disorders: Secondary | ICD-10-CM | POA: Diagnosis not present

## 2016-12-07 DIAGNOSIS — L700 Acne vulgaris: Secondary | ICD-10-CM | POA: Diagnosis not present

## 2016-12-13 ENCOUNTER — Ambulatory Visit (INDEPENDENT_AMBULATORY_CARE_PROVIDER_SITE_OTHER): Payer: BLUE CROSS/BLUE SHIELD | Admitting: Family Medicine

## 2016-12-13 ENCOUNTER — Encounter: Payer: Self-pay | Admitting: Family Medicine

## 2016-12-13 VITALS — BP 116/70 | HR 58 | Temp 98.1°F | Resp 16 | Ht 68.0 in | Wt 246.0 lb

## 2016-12-13 DIAGNOSIS — K2950 Unspecified chronic gastritis without bleeding: Secondary | ICD-10-CM | POA: Diagnosis not present

## 2016-12-13 DIAGNOSIS — R0981 Nasal congestion: Secondary | ICD-10-CM

## 2016-12-13 DIAGNOSIS — B9681 Helicobacter pylori [H. pylori] as the cause of diseases classified elsewhere: Secondary | ICD-10-CM

## 2016-12-13 DIAGNOSIS — E559 Vitamin D deficiency, unspecified: Secondary | ICD-10-CM | POA: Diagnosis not present

## 2016-12-13 MED ORDER — OMEPRAZOLE 40 MG PO CPDR: 40.0000 mg | DELAYED_RELEASE_CAPSULE | Freq: Two times a day (BID) | ORAL | 2 refills | Status: DC

## 2016-12-13 MED ORDER — CETIRIZINE-PSEUDOEPHEDRINE ER 5-120 MG PO TB12: 1.0000 | ORAL_TABLET | Freq: Two times a day (BID) | ORAL | 0 refills | Status: DC

## 2016-12-13 MED ORDER — ADVANCED PROBIOTIC PO CAPS: 1.0000 | ORAL_CAPSULE | Freq: Every day | ORAL | 0 refills | Status: DC

## 2016-12-13 MED ORDER — SIMETHICONE 80 MG PO CHEW: 80.0000 mg | CHEWABLE_TABLET | Freq: Four times a day (QID) | ORAL | 0 refills | Status: DC | PRN

## 2016-12-13 NOTE — Patient Instructions (Addendum)
Gradually add more food back into your diet - start with yogurt. Then gradually meats, fruits, and veggies. Then other dairy. We will recheck your blood work in 3 months to see if your vitamin D level is back to normal and then repeat calcium tests once it is.  IF you received an x-ray today, you will receive an invoice from [facility]. Please contact [facility] at [phone] with questions or concerns regarding your invoice.   IF you received labwork today, you will receive an invoice from [facility]. Please contact [facility] at [phone] with questions or concerns regarding your invoice.   Our billing staff will not be able to assist you with questions regarding bills from these companies.  You will be contacted with the lab results as soon as they are available. The fastest way to get your results is to activate your My Chart account. Instructions are located on the last page of this paperwork. If you have not heard from [place] regarding the results in 2 weeks, please contact this office.      Vitamin D Deficiency Vitamin D deficiency is when your body does not have enough vitamin D. Vitamin D is important to your body for many reasons:  It helps the body to absorb two important minerals, called calcium and phosphorus.  It plays a role in bone health.  It may help to prevent some diseases, such as diabetes and multiple sclerosis.  It plays a role in muscle function, including heart function.  You can get vitamin D by:  Eating foods that naturally contain vitamin D.  Eating or drinking milk or other dairy products that have vitamin D added to them.  Taking a vitamin D supplement or a multivitamin supplement that contains vitamin D.  Being in the sun. Your body naturally makes vitamin D when your skin is exposed to sunlight. Your body changes the sunlight into a form of the vitamin that the body can use.  If vitamin D deficiency is severe, it can cause a  condition in which your bones become soft. In adults, this condition is called osteomalacia. In children, this condition is called rickets. What are the causes? Vitamin D deficiency may be caused by:  Not eating enough foods that contain vitamin D.  Not getting enough sun exposure.  Having certain digestive system diseases that make it difficult for your body to absorb vitamin D. These diseases include Crohn disease, chronic pancreatitis, and cystic fibrosis.  Having a surgery in which a part of the stomach or a part of the small intestine is removed.  Being obese.  Having chronic kidney disease or liver disease.  What increases the risk? This condition is more likely to develop in:  Older people.  People who do not spend much time outdoors.  People who live in a long-term care facility.  People who have had broken bones.  People with weak or thin bones (osteoporosis).  People who have a disease or condition that changes how the body absorbs vitamin D.  People who have dark skin.  People who take certain medicines, such as steroid medicines or certain seizure medicines.  People who are overweight or obese.  What are the signs or symptoms? In mild cases of vitamin D deficiency, there may not be any symptoms. If the condition is severe, symptoms may include:  Bone pain.  Muscle pain.  Falling often.  Broken bones caused by a minor injury.  How is this diagnosed? This condition is usually diagnosed with  a blood test. How is this treated? Treatment for this condition may depend on what caused the condition. Treatment options include:  Taking vitamin D supplements.  Taking a calcium supplement. Your health care provider will suggest what dose is best for you.  Follow these instructions at home:  Take medicines and supplements only as told by your health care provider.  Eat foods that contain vitamin D. Choices include: ? Fortified dairy products, cereals, or  juices. Fortified means that vitamin D has been added to the food. Check the label on the package to be sure. ? Fatty fish, such as salmon or trout. ? Eggs. ? Oysters.  Do not use a tanning bed.  Maintain a healthy weight. Lose weight, if needed.  Keep all follow-up visits as told by your health care provider. This is important. Contact a health care provider if:  Your symptoms do not go away.  You feel like throwing up (nausea) or you throw up (vomit).  You have fewer bowel movements than usual or it is difficult for you to have a bowel movement (constipation). This information is not intended to replace advice given to you by your health care provider. Make sure you discuss any questions you have with your health care provider. Document Released: 05/17/2011 Document Revised: 08/06/2015 Document Reviewed: 07/10/2014 Elsevier Interactive Patient Education  2018 [employer]  Helicobacter Pylori Infection Helicobacter pylori infection is an infection in the stomach that is caused by the Helicobacter pylori (H. pylori) bacteria. This type of bacteria often lives in the lining of the stomach. The infection can cause ulcers and irritation (gastritis) in some people. It is the most common cause of ulcers in the stomach (gastric ulcer) and in the upper part of the intestine (duodenal ulcer). Having this infection may also increase the risk of stomach cancer and a type of white blood cell cancer (lymphoma) that affects the stomach. What are the causes? H. pylori is a type of bacteria that is often found in the stomachs of healthy people. The bacteria may be passed from person to person through contact with stool or saliva. It is not known why some people develop ulcers, gastritis, or cancer from the infection. What increases the risk? This condition is more likely to develop in people who:  Have family members with the infection.  Live with many other people, such as in a dormitory.  Are  of African, Hispanic, or Asian descent.  What are the signs or symptoms? Most people with this infection do not have symptoms. If you do have symptoms, they may include:  Heartburn.  Stomach pain.  Nausea.  Vomiting.  Blood-tinged vomit.  Loss of appetite.  Bad breath.  How is this diagnosed? This condition may be diagnosed based on your symptoms, a physical exam, and various tests. Tests may include:  Blood tests or stool tests to check for the proteins (antibodies) that your body may produce in response to the bacteria. These tests are the best way to confirm the diagnosis.  A breath test to check for the type of gas that the H. pylori bacteria release after breaking down a substance called urea. For the test, you are asked to drink urea. This test is often done after treatment in order to find out if the treatment worked.  A procedure in which a thin, flexible tube with a tiny camera at the end is placed into your stomach and upper intestine (upper endoscopy). Your health care provider may also take tissue samples (  biopsy) to test for H. pylori and cancer.  How is this treated? Treatment for this condition usually involves taking a combination of medicines (triple therapy) for a couple of weeks. Triple therapy includes one medicine to reduce the acid in your stomach and two types of antibiotic medicines. Many drug combinations have been approved for treatment. Treatment usually kills the H. pylori and reduces your risk of cancer. You may need to be tested for H. pylori again after treatment. In some cases, the treatment may need to be repeated. Follow these instructions at home:  Take over-the-counter and prescription medicines only as told by your health care provider.  Take your antibiotics as told by your health care provider. Do not stop taking the antibiotics even if you start to feel better.  You can do all your usual activities and eat what you usually do.  Take steps  to prevent future infections: ? Wash your hands often. ? Make sure the food you eat has been properly prepared. ? Drink water only from clean sources.  Keep all follow-up visits as told by your health care provider. This is important. Contact a health care provider if:  Your symptoms do not get better.  Your symptoms return after treatment. This information is not intended to replace advice given to you by your health care provider. Make sure you discuss any questions you have with your health care provider. Document Released: 06/16/2015 Document Revised: 07/31/2015 Document Reviewed: 03/06/2014 Elsevier Interactive Patient Education  2018 [employer].

## 2016-12-13 NOTE — Progress Notes (Signed)
Subjective:    Patient ID: Larry Hayden, male    DOB: 26-Nov-1985, 31 y.o.   MRN: [id number] Chief Complaint  Patient presents with  . Follow-up    abdominal pain - resolved  . Nasal Congestion    x3 days, denies cough and fevers     HPI  Cove is a 31 yo male who is here to follow-up on years of worsening chronic epigastric/RUQ abdominal pain, distention, gas with decreased appetite, early satiety, and weight gain.  He was found to have + H. Pylori and so at our last visit 3 wks ago was started on triple therapy treatment x  2wks.  Since then his sxs have gotten SO much better. Still has gas but bloating and pain have resolved - really delighted on the drastic improvement.  Has only been eating broth, rice, bread, and water.  He is scared to eat anything else - that it will trigger his sxs. He is on ANOTHER course of amox 500 from his dentist right now.   Hypercalcemia persists. Vitamin D still low 2 months prior at 21 so reccommended to start on high dose once weekly 50 K supplement x 1 year. Seen by Dr. Elvera Lennox for possible hyperparathyroidism 2.5 years prior. 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 However she did want Vitamin D to be repeated and then urine collection for 24 hours calcium recheck.   Did have flu shot 2 wks ago. Tried nasal spray before rx'd by me for nasal cong and didn't work.   Past Medical History:  Diagnosis Date  . Acne   . History of hypercalcemia 2015  . Hyperactive gag reflex   . Hyperparathyroidism (HCC)   . Pilonidal sinus 04/2015  . Tooth injury    right upper front - hx. of repair  . Vitamin D deficiency    Past Surgical History:  Procedure Laterality Date  . NO PAST SURGERIES    . PILONIDAL CYST EXCISION N/A 04/23/2015   Procedure: CYST EXCISION PILONIDAL EXTENSIVE;  Surgeon: Almond Lint, MD;  Location: [facility];  Service: General;  Laterality: N/A;   Current Outpatient Prescriptions on File Prior to Visit    Medication Sig Dispense Refill  . Cholecalciferol (VITAMIN D3) 5000 units CAPS Take 1 capsule (5,000 Units total) by mouth daily. 90 capsule 1   No current facility-administered medications on file prior to visit.    No Known Allergies Family History  Problem Relation Age of Onset  . Cancer Mother   . Heart disease Maternal Grandmother    Social History   Social History  . Marital status: Married    Spouse name: N/A  . Number of children: 0  . Years of education: College   Occupational History  . gas station attendant [employer]   Social History Main Topics  . Smoking status: Former Smoker    Packs/day: 1.00    Years: 13.00    Types: Cigarettes    Quit date: 07/28/2015  . Smokeless tobacco: Never Used  . Alcohol use 0.0 oz/week     Comment: occasionally  . Drug use: No  . Sexual activity: Not Asked   Other Topics Concern  . None   Social History Narrative   Originally from [place].   Came to the [place] in 2014.   Has a degree in Public relations account executive, and served in the [employer].   Married summer 2018. His wife lives in [place].   Lives with his sister.  Depression screen [facility] 2/9 12/13/2016 11/24/2016 11/17/2016 06/25/2016 08/05/2015  Decreased Interest 0 0 0 0 0  Down, Depressed, Hopeless 0 0 0 0 0  PHQ - 2 Score 0 0 0 0 0  Altered sleeping - - - - -  Tired, decreased energy - - - - -  Change in appetite - - - - -  Feeling bad or failure about yourself  - - - - -  Trouble concentrating - - - - -  Moving slowly or fidgety/restless - - - - -  Suicidal thoughts - - - - -  PHQ-9 Score - - - - -     Review of Systems See hpi    Objective:   Physical Exam  Constitutional: He is oriented to person, place, and time. He appears well-developed and well-nourished. No distress.  HENT:  Head: Normocephalic and atraumatic.  Right Ear: External ear and ear canal normal. Tympanic membrane is erythematous.  Left Ear: External ear and ear canal normal. A middle ear effusion is  present.  Nose: Rhinorrhea present.  Mouth/Throat: Oropharynx is clear and moist and mucous membranes are normal. No oropharyngeal exudate.  Eyes: Conjunctivae are normal. No scleral icterus.  Neck: Normal range of motion. Neck supple. No thyromegaly present.  Cardiovascular: Normal rate, regular rhythm, normal heart sounds and intact distal pulses.   Pulmonary/Chest: Effort normal and breath sounds normal. No respiratory distress.  Abdominal: Soft. Bowel sounds are normal. He exhibits no distension and no mass. There is tenderness in the right lower quadrant. There is no rebound and no guarding.  Musculoskeletal: He exhibits no edema.  Lymphadenopathy:    He has no cervical adenopathy.  Neurological: He is alert and oriented to person, place, and time.  Skin: Skin is warm and dry. He is not diaphoretic. No erythema.  Psychiatric: He has a normal mood and affect. His behavior is normal.      BP 116/70   Pulse (!) 58   Temp 98.1 F (36.7 C) (Oral)   Resp 16   Ht  (1.727 m)   Wt 246 lb (111.6 kg)   SpO2 98%   BMI 37.40 kg/m   Assessment & Plan:    1. Helicobacter pylori gastritis (chronic gastritis) - abd sxs DRASTICALLY improved - pt has lost weight - bloating is notable improved. He is on another course of amox from his dentist so rec he start a course of probiotics. Encouraged pt to slowly widen diet- start yogurt.  Try gas-x. Ok to cont ppi for sev more mos.  2. Congestion of nasal sinus - suspect mild URI vs allergies. Failed nasal spray prior. Start zyrtec-D.  3.      Vit D def - states he is taking 5000 every 5d - we have had many language miscommunications about vit D replacement so the important part is that he is now continuing to take it reg - check level again at next visit.- need to get up to normal so we can recheck calcium and PTH as if abd pain sxs do not resolve this could be the abd pain sequelae of hypercalcemia (unlikley) 4.      Hypercalcemia -  - saw Dr.  Elvera Lennox prior, thought to be familial and benign but cannot be definitely sure until vit D is replaced and tests repeated inc 24 hr urine.  Meds ordered this encounter  Medications  . omeprazole (PRILOSEC) 40 MG capsule    Sig: Take 1 capsule (40 mg total) by  mouth 2 (two) times daily before a meal.    Dispense:  60 capsule    Refill:  2  . Probiotic Product (ADVANCED PROBIOTIC) CAPS    Sig: Take 1 capsule by mouth daily.    Dispense:  30 capsule    Refill:  0  . cetirizine-pseudoephedrine (ZYRTEC-D) 5-120 MG tablet    Sig: Take 1 tablet by mouth 2 (two) times daily. For nasal congestion    Dispense:  30 tablet    Refill:  0  . simethicone (GAS-X) 80 MG chewable tablet    Sig: Chew 1 tablet (80 mg total) by mouth every 6 (six) hours as needed for flatulence.    Dispense:  30 tablet    Refill:  0    [name], M.D.  Primary Care at [facility] [address] [place], [place] [address] [phone] phone [phone] fax  12/15/16 12:00 AM

## 2016-12-28 ENCOUNTER — Encounter: Payer: Self-pay | Admitting: Internal Medicine

## 2016-12-28 ENCOUNTER — Encounter: Payer: Self-pay | Admitting: Physician Assistant

## 2016-12-28 ENCOUNTER — Ambulatory Visit (INDEPENDENT_AMBULATORY_CARE_PROVIDER_SITE_OTHER): Payer: BLUE CROSS/BLUE SHIELD | Admitting: Physician Assistant

## 2016-12-28 VITALS — BP 132/84 | HR 77 | Temp 98.4°F | Resp 18 | Ht 68.0 in | Wt 237.8 lb

## 2016-12-28 DIAGNOSIS — J019 Acute sinusitis, unspecified: Secondary | ICD-10-CM

## 2016-12-28 MED ORDER — AMOXICILLIN-POT CLAVULANATE 875-125 MG PO TABS: 1.0000 | ORAL_TABLET | Freq: Two times a day (BID) | ORAL | 0 refills | Status: DC

## 2016-12-28 NOTE — Progress Notes (Signed)
Patient ID: Larry Hayden, male    DOB: [birth year], [age] y.o.   MRN: [id number]  PCP: Sherren MochaShaw, Eva N, MD  Chief Complaint  Patient presents with  . Eye Problem    x3 days, Pt states he isn't having any trouble seeing but his eyes feel like they're burning and itchy. Pt hasn't noticed any discharge. Pt states he was using OTC Visine-A eye allergy and it helped for a little bit.  . Sinus Problem    x2 weeks, pt states he has sinus congestion and his face feels full.    Subjective:   Presents for evaluation of burning, itching eyes x 3 days and sinus congestion x 2 weeks.  No visual changes, no drainage from the eyes, no photophobia. He has noticed some redness of the white parts of his eyes, and Visine-A drops helped. No known contacts with pink eye.  Nasal congestion requires mouth breathing. Drainage is yellow/white. Face feels "full." Associated fatigue and malaise and chills. No fever, headache, ear pain/pressure, sore throat, cough.  Flu vaccine received 3-4 weeks ago.   Review of Systems As above    Patient Active Problem List   Diagnosis Date Noted  . BMI 40.0-44.9, adult (HCC) 11/19/2016  . Prediabetes 09/17/2016  . Hyperparathyroidism (HCC) 12/17/2013  . Vitamin D deficiency 12/17/2013  . Hypercalcemia 06/04/2013  . Stiffness of hand joint 06/04/2013  . Smoking 06/04/2013  . Tobacco use disorder 05/11/2013  . Other and unspecified hyperlipidemia 03/30/2013  . Elevated BP 02/16/2013  . Acne 02/16/2013     Prior to Admission medications   Medication Sig Start Date End Date Taking? Authorizing Provider  cetirizine-pseudoephedrine (ZYRTEC-D) 5-120 MG tablet Take 1 tablet by mouth 2 (two) times daily. For nasal congestion 12/13/16  Yes Sherren MochaShaw, Eva N, MD  Cholecalciferol (VITAMIN D3) 5000 units CAPS Take 1 capsule (5,000 Units total) by mouth daily. 11/24/16  Yes Sherren MochaShaw, Eva N, MD  omeprazole (PRILOSEC) 40 MG capsule Take 1 capsule (40 mg total) by mouth 2 (two) times  daily before a meal. 12/13/16  Yes Sherren MochaShaw, Eva N, MD  Probiotic Product (ADVANCED PROBIOTIC) CAPS Take 1 capsule by mouth daily. 12/13/16  Yes Sherren MochaShaw, Eva N, MD  simethicone (GAS-X) 80 MG chewable tablet Chew 1 tablet (80 mg total) by mouth every 6 (six) hours as needed for flatulence. 12/13/16  Yes Sherren MochaShaw, Eva N, MD     No Known Allergies     Objective:  Physical Exam  Constitutional: He is oriented to person, place, and time. He appears well-developed and well-nourished. He is active and cooperative. No distress.  BP 132/84 (BP Location: Left Arm, Patient Position: Sitting, Cuff Size: Large)   Pulse 77   Temp 98.4 F (36.9 C) (Oral)   Resp 18   Ht 5\' 8"  (1.727 m)   Wt 237 lb 12.8 oz (107.9 kg)   SpO2 98%   BMI 36.16 kg/m   HENT:  Head: Normocephalic and atraumatic.  Right Ear: Hearing normal.  Left Ear: Hearing normal.  Eyes: Pupils are equal, round, and reactive to light. EOM are normal. Right conjunctiva is injected. Right conjunctiva has no hemorrhage. Left conjunctiva is not injected. Left conjunctiva has no hemorrhage. No scleral icterus.  Neck: Normal range of motion. Neck supple. No thyromegaly present.  Cardiovascular: Normal rate, regular rhythm and normal heart sounds.   Pulses:      Radial pulses are 2+ on the right side, and 2+ on the left side.  Pulmonary/Chest: Effort  normal and breath sounds normal.  Lymphadenopathy:       Head (right side): No tonsillar, no preauricular, no posterior auricular and no occipital adenopathy present.       Head (left side): No tonsillar, no preauricular, no posterior auricular and no occipital adenopathy present.    He has no cervical adenopathy.       Right: No supraclavicular adenopathy present.       Left: No supraclavicular adenopathy present.  Neurological: He is alert and oriented to person, place, and time. No sensory deficit.  Skin: Skin is warm, dry and intact. No rash noted. No cyanosis or erythema. Nails show no clubbing.    Psychiatric: He has a normal mood and affect. His speech is normal and behavior is normal.           Assessment & Plan:   1. Acute non-recurrent sinusitis, unspecified location Supportive care.  Anticipatory guidance.  RTC if symptoms worsen/persist. COnsider change to rewetting drops/natural tears in place of the Visine. - amoxicillin-clavulanate (AUGMENTIN) 875-125 MG tablet; Take 1 tablet by mouth 2 (two) times daily.  Dispense: 20 tablet; Refill: 0    Return if symptoms worsen or fail to improve.   Fernande Bras, PA-C Primary Care at [facility]

## 2016-12-28 NOTE — Progress Notes (Signed)
Subjective:    Patient ID: Lyndall Windt, male    DOB: 05-27-1985, 31 y.o.   MRN: [id number]  HPI  Mr. Hobbins is a 31 year old man with a past medical history significant for tobacco use who presents today with itchy watery eyes and nasal congestion. Mr. Popp reports his eyes became itchy, red, and irritated 3-4 days ago. He began Visine-A eye drops 2 days ago which provided some relief. Mr. Holloway reports his eyes feel full and puffy. He denies drainage and changes in vision.   Mr. Tortorella reports he has had nasal congestion for 2 weeks. He states he has difficulty breathing through his nose. Patient also reports nasal drainage that is white and yellow in color. Mr. Lehnen has not tried any medications or nasal sprays. He reports he feels weak and has the chills. He denies fever, headaches or ear pain. He also denies sore throat, cough, or shortness of breath.   Of note, Mr. Jeppsen received his flu shot 3-4 weeks ago. He does not have any known sick contacts.    Prior to Admission medications   Medication Sig Start Date End Date Taking? Authorizing Provider  cetirizine-pseudoephedrine (ZYRTEC-D) 5-120 MG tablet Take 1 tablet by mouth 2 (two) times daily. For nasal congestion 12/13/16  Yes Sherren Mocha, MD  Cholecalciferol (VITAMIN D3) 5000 units CAPS Take 1 capsule (5,000 Units total) by mouth daily. 11/24/16  Yes Sherren Mocha, MD  omeprazole (PRILOSEC) 40 MG capsule Take 1 capsule (40 mg total) by mouth 2 (two) times daily before a meal. 12/13/16  Yes Sherren Mocha, MD  Probiotic Product (ADVANCED PROBIOTIC) CAPS Take 1 capsule by mouth daily. 12/13/16  Yes Sherren Mocha, MD  simethicone (GAS-X) 80 MG chewable tablet Chew 1 tablet (80 mg total) by mouth every 6 (six) hours as needed for flatulence. 12/13/16  Yes Sherren Mocha, MD   Allergies: No Known Allergies  Surgeries:  Past Surgical History:  Procedure Laterality Date  . NO PAST SURGERIES    . PILONIDAL CYST  EXCISION N/A 04/23/2015   Procedure: CYST EXCISION PILONIDAL EXTENSIVE;  Surgeon: Almond Lint, MD;  Location: [facility];  Service: General;  Laterality: N/A;   Review of Systems     Objective:   Physical Exam  Constitutional: He appears well-developed and well-nourished. He is cooperative.  BP 132/84 (BP Location: Left Arm, Patient Position: Sitting, Cuff Size: Large)   Pulse 77   Temp 98.4 F (36.9 C) (Oral)   Resp 18   Ht 5\' 8"  (1.727 m)   Wt 237 lb 12.8 oz (107.9 kg)   SpO2 98%   BMI 36.16 kg/m    HENT:  Head: Normocephalic and atraumatic.  Right Ear: External ear normal.  Left Ear: External ear normal.  Nose: Nose normal.  Mouth/Throat: Oropharynx is clear and moist. No oropharyngeal exudate.  Eyes: Pupils are equal, round, and reactive to light. EOM are normal. Right eye exhibits no discharge. Left eye exhibits no discharge.  Right sclera erythematous.  Neck: Normal range of motion. Neck supple. No thyromegaly present.  Cardiovascular: Normal rate, regular rhythm and normal heart sounds.   No murmur heard. Pulmonary/Chest: Effort normal and breath sounds normal. He has no wheezes.  Lymphadenopathy:    He has no cervical adenopathy.  Neurological: He is alert.  Skin: Skin is warm and dry.      Assessment & Plan:  1. Acute sinusitis - Begin Augmentin 875-125mg  BID x 10 days. -  Continue Visine-A drops. Informed patient that sometimes patients report Visine-A dries out the eyes, so if that occurs, patient can discontinue Visine-A and begin Artificial Tears drops. - Return to clinic with worsening symptoms, onset of a fever, or no improvement in symptoms.   Judie Petitaylin Carvel Huskins, PA-S

## 2016-12-28 NOTE — Patient Instructions (Signed)

## 2017-01-11 ENCOUNTER — Encounter: Payer: Self-pay | Admitting: Internal Medicine

## 2017-01-11 ENCOUNTER — Ambulatory Visit (AMBULATORY_SURGERY_CENTER): Payer: BLUE CROSS/BLUE SHIELD | Admitting: Internal Medicine

## 2017-01-11 VITALS — BP 120/68 | HR 73 | Temp 99.1°F | Resp 10 | Ht 67.0 in | Wt 259.0 lb

## 2017-01-11 DIAGNOSIS — R140 Abdominal distension (gaseous): Secondary | ICD-10-CM | POA: Diagnosis not present

## 2017-01-11 DIAGNOSIS — R635 Abnormal weight gain: Secondary | ICD-10-CM | POA: Diagnosis not present

## 2017-01-11 DIAGNOSIS — R1013 Epigastric pain: Secondary | ICD-10-CM | POA: Diagnosis not present

## 2017-01-11 DIAGNOSIS — R109 Unspecified abdominal pain: Secondary | ICD-10-CM | POA: Diagnosis not present

## 2017-01-11 MED ORDER — SODIUM CHLORIDE 0.9 % IV SOLN: 500.0000 mL | INTRAVENOUS | Status: DC

## 2017-01-11 NOTE — Op Note (Signed)
[facility] Patient Name: Larry Hayden Procedure Date: 01/11/2017 10:34 AM MRN: [id number] Endoscopist: Wilhemina Bonito. Marina Goodell , MD Age: 31 Referring MD:  Date of Birth: 04-05-1985 Gender: Male Account #: [id number] Procedure:                Upper GI endoscopy, with biopsy Indications:              Dyspepsia, Abdominal bloating Medicines:                Monitored Anesthesia Care Procedure:                Pre-Anesthesia Assessment:                           - Prior to the procedure, a History and Physical                            was performed, and patient medications and                            allergies were reviewed. The patient's tolerance of                            previous anesthesia was also reviewed. The risks                            and benefits of the procedure and the sedation                            options and risks were discussed with the patient.                            All questions were answered, and informed consent                            was obtained. Prior Anticoagulants: The patient has                            taken no previous anticoagulant or antiplatelet                            agents. ASA Grade Assessment: I - A normal, healthy                            patient. After reviewing the risks and benefits,                            the patient was deemed in satisfactory condition to                            undergo the procedure.                           After obtaining informed consent, the endoscope was  passed under direct vision. Throughout the                            procedure, the patient's blood pressure, pulse, and                            oxygen saturations were monitored continuously. The                            Endoscope was introduced through the mouth, and                            advanced to the second part of duodenum. The upper                            GI endoscopy was  accomplished without difficulty.                            The patient tolerated the procedure well. Scope In: Scope Out: Findings:                 The esophagus was normal.                           The stomach was normal. Biopsies were taken with a                            cold forceps for Helicobacter pylori testing using                            CLOtest.                           The examined duodenum was normal.                           The cardia and gastric fundus were normal on                            retroflexion. Complications:            No immediate complications. Estimated Blood Loss:     Estimated blood loss: none. Impression:               - Normal EGD                           - Fatty liver. Recommendation:           - Patient has a contact number available for                            emergencies. The signs and symptoms of potential                            delayed complications were discussed with the  patient. Return to normal activities tomorrow.                            Written discharge instructions were provided to the                            patient.                           - Exercise and weight loss.                           - Follow-up CLO biopsy.                           - Return to the care of your primary provider. Wilhemina BonitoJohn N. Marina GoodellPerry, MD 01/11/2017 10:51:31 AM This report has been signed electronically.

## 2017-01-11 NOTE — Patient Instructions (Signed)
YOU HAD AN ENDOSCOPIC PROCEDURE TODAY AT THE [facility]:   Refer to the procedure report that was given to you for any specific questions about what was found during the examination.  If the procedure report does not answer your questions, please call your gastroenterologist to clarify.  If you requested that your care partner not be given the details of your procedure findings, then the procedure report has been included in a sealed envelope for you to review at your convenience later.  YOU SHOULD EXPECT: Some feelings of bloating in the abdomen. Passage of more gas than usual.  Walking can help get rid of the air that was put into your GI tract during the procedure and reduce the bloating.   Please Note:  You might notice some irritation and congestion in your nose or some drainage.  This is from the oxygen used during your procedure.  There is no need for concern and it should clear up in a day or so.  SYMPTOMS TO REPORT IMMEDIATELY:    Following upper endoscopy (EGD)  Vomiting of blood or coffee ground material  New chest pain or pain under the shoulder blades  Painful or persistently difficult swallowing  New shortness of breath  Fever of 100F or higher  Black, tarry-looking stools  For urgent or emergent issues, a gastroenterologist can be reached at any hour by calling [phone].   DIET:  We do recommend a small meal at first, but then you may proceed to your regular diet.  Drink plenty of fluids but you should avoid alcoholic beverages for 24 hours.  ACTIVITY:  You should plan to take it easy for the rest of today and you should NOT DRIVE or use heavy machinery until tomorrow (because of the sedation medicines used during the test).    FOLLOW UP: Our staff will call the number listed on your records the next business day following your procedure to check on you and address any questions or concerns that you may have regarding the information given to you  following your procedure. If we do not reach you, we will leave a message.  However, if you are feeling well and you are not experiencing any problems, there is no need to return our call.  We will assume that you have returned to your regular daily activities without incident.  If any biopsies were taken you will be contacted by phone or by letter within the next 1-3 weeks.  Please call us at [phone] if you have not heard about the biopsies in 3 weeks.    SIGNATURES/CONFIDENTIALITY: You and/or your care partner have signed paperwork which will be entered into your electronic medical record.  These signatures attest to the fact that that the information above on your After Visit Summary has been reviewed and is understood.  Full responsibility of the confidentiality of this discharge information lies with you and/or your care-partner.  Read all of the handouts given to you by your recovery room nurse. 

## 2017-01-11 NOTE — Progress Notes (Signed)
Spontaneous respirations throughout. VSS. Resting comfortably. To PACU on room air. Report to  RN. 

## 2017-01-12 ENCOUNTER — Telehealth: Payer: Self-pay | Admitting: *Deleted

## 2017-01-12 LAB — HELICOBACTER PYLORI SCREEN-BIOPSY: UREASE: POSITIVE — AB

## 2017-01-12 NOTE — Telephone Encounter (Signed)
  Follow up Call-  Call back number 01/11/2017  Post procedure Call Back phone  # [phone]  Permission to leave phone message Yes  Some recent data might be hidden     Patient questions:  Do you have a fever, pain , or abdominal swelling? No. Pain Score  0 *  Have you tolerated food without any problems? Yes.    Have you been able to return to your normal activities? Yes.    Do you have any questions about your discharge instructions: Diet   No. Medications  No. Follow up visit  No.  Do you have questions or concerns about your Care? No.  Actions: * If pain score is 4 or above: No action needed, pain <4.

## 2017-01-12 NOTE — Telephone Encounter (Signed)
Message left

## 2017-01-13 ENCOUNTER — Other Ambulatory Visit: Payer: Self-pay

## 2017-01-13 MED ORDER — BIS SUBCIT-METRONID-TETRACYC 140-125-125 MG PO CAPS: 3.0000 | ORAL_CAPSULE | Freq: Three times a day (TID) | ORAL | 0 refills | Status: DC

## 2017-01-15 ENCOUNTER — Encounter: Payer: Self-pay | Admitting: Physician Assistant

## 2017-01-15 ENCOUNTER — Ambulatory Visit (INDEPENDENT_AMBULATORY_CARE_PROVIDER_SITE_OTHER): Payer: BLUE CROSS/BLUE SHIELD | Admitting: Physician Assistant

## 2017-01-15 ENCOUNTER — Other Ambulatory Visit: Payer: Self-pay

## 2017-01-15 VITALS — BP 128/81 | HR 78 | Temp 98.2°F | Resp 16 | Ht 69.0 in | Wt 231.8 lb

## 2017-01-15 DIAGNOSIS — E213 Hyperparathyroidism, unspecified: Secondary | ICD-10-CM | POA: Diagnosis not present

## 2017-01-15 DIAGNOSIS — F39 Unspecified mood [affective] disorder: Secondary | ICD-10-CM | POA: Diagnosis not present

## 2017-01-15 DIAGNOSIS — Z0000 Encounter for general adult medical examination without abnormal findings: Secondary | ICD-10-CM

## 2017-01-15 DIAGNOSIS — R7303 Prediabetes: Secondary | ICD-10-CM | POA: Diagnosis not present

## 2017-01-15 DIAGNOSIS — E785 Hyperlipidemia, unspecified: Secondary | ICD-10-CM | POA: Diagnosis not present

## 2017-01-15 DIAGNOSIS — E559 Vitamin D deficiency, unspecified: Secondary | ICD-10-CM | POA: Diagnosis not present

## 2017-01-15 DIAGNOSIS — R030 Elevated blood-pressure reading, without diagnosis of hypertension: Secondary | ICD-10-CM | POA: Diagnosis not present

## 2017-01-15 DIAGNOSIS — IMO0001 Reserved for inherently not codable concepts without codable children: Secondary | ICD-10-CM

## 2017-01-15 DIAGNOSIS — F17200 Nicotine dependence, unspecified, uncomplicated: Secondary | ICD-10-CM | POA: Diagnosis not present

## 2017-01-15 NOTE — Assessment & Plan Note (Signed)
Await lab results. 

## 2017-01-15 NOTE — Assessment & Plan Note (Signed)
Encourage smoking cessation

## 2017-01-15 NOTE — Progress Notes (Signed)
Patient ID: Larry Hayden, male    DOB: 1985/10/17, 31 y.o.   MRN: [id number]  PCP: Sherren MochaShaw, Eva N, MD  Chief Complaint  Patient presents with  . Annual Exam    Subjective:   Presents for [name].  Had an upper endoscopy last week. He has changed his diet considerably since H pylori infection, being afraid of recurrent symptoms with certain foods.  Colorectal Cancer Screening: Not yet a candidate. Prostate Cancer Screening: Not yet a candidate. Bone Density Testing: Not yet a candidate. HIV Screening: Negative 11/17/2016 Seasonal Influenza Vaccination: current Td/Tdap Vaccination: 2015 Pneumococcal Vaccination: Not yet a candidate. Zoster Vaccination: Not yet a candidate. Frequency of Dental evaluation: Q6 months Frequency of Eye evaluation: doesn't have an eye specialist-recently realized he has night vision problems. 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     Patient Active Problem List   Diagnosis Date Noted  . BMI 40.0-44.9, adult (HCC) 11/19/2016  . Prediabetes 09/17/2016  . Hyperparathyroidism (HCC) 12/17/2013  . Vitamin D deficiency 12/17/2013  . Hypercalcemia 06/04/2013  . Stiffness of hand joint 06/04/2013  . Smoking 06/04/2013  . Tobacco use disorder 05/11/2013  . Other and unspecified hyperlipidemia 03/30/2013  . Elevated BP 02/16/2013  . Acne 02/16/2013    Past Medical History:  Diagnosis Date  . Acne   . History of hypercalcemia 2015  . Hyperactive gag reflex   . Hyperparathyroidism (HCC)   . Pilonidal sinus 04/2015  . Tooth injury    right upper front - hx. of repair  . Vitamin D deficiency      Prior to Admission medications   Medication Sig Start Date End Date Taking? Authorizing Provider  Cholecalciferol (VITAMIN D3) 5000 units CAPS Take 1 capsule (5,000 Units total) by mouth daily. 11/24/16  Yes Sherren MochaShaw, Eva N, MD  omeprazole (PRILOSEC) 40 MG capsule Take 1 capsule (40 mg total)  by mouth 2 (two) times daily before a meal. 12/13/16  Yes Sherren MochaShaw, Eva N, MD  Probiotic Product (ADVANCED PROBIOTIC) CAPS Take 1 capsule by mouth daily. 12/13/16  Yes Sherren MochaShaw, Eva N, MD  bismuth-metronidazole-tetracycline [facility](PYLERA) [phone] MG capsule Take 3 capsules 4 (four) times daily -  before meals and at bedtime by mouth. Patient not taking: Reported on 01/15/2017 01/13/17   Hilarie FredricksonPerry, John N, MD  cetirizine-pseudoephedrine (ZYRTEC-D) 5-120 MG tablet Take 1 tablet by mouth 2 (two) times daily. For nasal congestion Patient not taking: Reported on 01/15/2017 12/13/16   Sherren MochaShaw, Eva N, MD  simethicone (GAS-X) 80 MG chewable tablet Chew 1 tablet (80 mg total) by mouth every 6 (six) hours as needed for flatulence. Patient not taking: Reported on 01/11/2017 12/13/16   Sherren MochaShaw, Eva N, MD    No Known Allergies  Past Surgical History:  Procedure Laterality Date  . NO PAST SURGERIES      Family History  Problem Relation Age of Onset  . Cancer Mother   . Heart disease Maternal Grandmother     Social History   Socioeconomic History  . Marital status: Married    Spouse name: None  . Number of children: 0  . Years of education: College  . Highest education level: None  Social Needs  . Financial resource strain: None  . Food insecurity - worry: None  . Food insecurity - inability: None  . Transportation needs - medical: None  . Transportation needs - non-medical: None  Occupational History  . Occupation: gas station  attendant    Employer: [employer]  Tobacco Use  . Smoking status: Former Smoker    Packs/day: 1.00    Years: 13.00    Pack years: 13.00    Types: Cigarettes    Last attempt to quit: 07/28/2015    Years since quitting: 1.4  . Smokeless tobacco: Never Used  Substance and Sexual Activity  . Alcohol use: No    Alcohol/week: 0.0 oz    Frequency: Never  . Drug use: No  . Sexual activity: None  Other Topics Concern  . None  Social History Narrative   Originally from [place].   Came to  the [place] in [place].   Has a degree in Public relations account executiveengineering, and served in the [employer].   Married summer 2018. His wife lives in [place].   Lives with his sister.       Review of Systems  Constitutional: Negative.   HENT: Negative.   Eyes: Negative.   Respiratory: Negative.   Cardiovascular: Negative.   Gastrointestinal: Negative.   Endocrine: Negative.   Genitourinary: Negative.   Musculoskeletal: Negative.   Skin: Negative.   Allergic/Immunologic: Negative.   Neurological: Negative.   Hematological: Negative.   Psychiatric/Behavioral: Negative.         Objective:  Physical Exam  Constitutional: He is oriented to person, place, and time. He appears well-developed and well-nourished. He is active and cooperative.  Non-toxic appearance. He does not have a sickly appearance. He does not appear ill. No distress.  BP 128/81   Pulse 78   Temp 98.2 F (36.8 C)   Resp 16   Ht 5\' 9"  (1.753 m)   Wt 231 lb 12.8 oz (105.1 kg)   SpO2 99%   BMI 34.23 kg/m    HENT:  Head: Normocephalic and atraumatic.  Right Ear: Hearing, tympanic membrane, external ear and ear canal normal.  Left Ear: Hearing, tympanic membrane, external ear and ear canal normal.  Nose: Nose normal.  Mouth/Throat: Uvula is midline, oropharynx is clear and moist and mucous membranes are normal. He does not have dentures. No oral lesions. No trismus in the jaw. Normal dentition. No dental abscesses, uvula swelling, lacerations or dental caries.  Eyes: Conjunctivae, EOM and lids are normal. Pupils are equal, round, and reactive to light. Right eye exhibits no discharge. Left eye exhibits no discharge. No scleral icterus.  Fundoscopic exam:      The right eye shows no arteriolar narrowing, no AV nicking, no exudate, no hemorrhage and no papilledema.       The left eye shows no arteriolar narrowing, no AV nicking, no exudate, no hemorrhage and no papilledema.  Neck: Normal range of motion, full passive range of motion  without pain and phonation normal. Neck supple. No spinous process tenderness and no muscular tenderness present. No neck rigidity. No tracheal deviation, no edema, no erythema and normal range of motion present. No thyromegaly present.  Cardiovascular: Normal rate, regular rhythm, S1 normal, S2 normal, normal heart sounds, intact distal pulses and normal pulses. Exam reveals no gallop and no friction rub.  No murmur heard. Pulmonary/Chest: Effort normal and breath sounds normal. No respiratory distress. He has no wheezes. He has no rales.  Abdominal: Soft. Normal appearance and bowel sounds are normal. He exhibits no distension and no mass. There is no hepatosplenomegaly. There is no tenderness. There is no rebound and no guarding. No hernia.  Musculoskeletal: Normal range of motion. He exhibits no edema or tenderness.  Cervical back: Normal. He exhibits normal range of motion, no tenderness, no bony tenderness, no swelling, no edema, no deformity, no laceration, no pain, no spasm and normal pulse.       Thoracic back: Normal.       Lumbar back: Normal.  Lymphadenopathy:       Head (right side): No submental, no submandibular, no tonsillar, no preauricular, no posterior auricular and no occipital adenopathy present.       Head (left side): No submental, no submandibular, no tonsillar, no preauricular, no posterior auricular and no occipital adenopathy present.    He has no cervical adenopathy.       Right: No supraclavicular adenopathy present.       Left: No supraclavicular adenopathy present.  Neurological: He is alert and oriented to person, place, and time. He has normal strength and normal reflexes. He displays no tremor. No cranial nerve deficit. He exhibits normal muscle tone. Coordination and gait normal.  Skin: Skin is warm, dry and intact. No abrasion, no ecchymosis, no laceration, no lesion and no rash noted. He is not diaphoretic. No cyanosis or erythema. No pallor. Nails show no  clubbing.  Psychiatric: He has a normal mood and affect. His speech is normal and behavior is normal. Judgment and thought content normal. Cognition and memory are normal.       Wt Readings from Last 3 Encounters:  01/15/17 231 lb 12.8 oz (105.1 kg)  01/11/17 259 lb (117.5 kg)  12/28/16 237 lb 12.8 oz (107.9 kg)       Assessment & Plan:   Problem List Items Addressed This Visit    Elevated BP without diagnosis of hypertension    Recommend continued healthy lifestyle choices.      Relevant Orders   CBC with Differential/Platelet   Urinalysis, dipstick only   Hyperlipidemia    Await lab results.      Relevant Orders   Comprehensive metabolic panel   Lipid panel   Hypercalcemia   Smoking    Encourage smoking cessation.      Relevant Orders   CBC with Differential/Platelet   Hyperparathyroidism (HCC)   Relevant Orders   Comprehensive metabolic panel   Vitamin D deficiency   Relevant Orders   Vitamin D 1,25 dihydroxy   Prediabetes   Relevant Orders   Comprehensive metabolic panel   Hemoglobin A1c   Episodic mood disorder (HCC)    Currently stable without treatment.       Other Visit Diagnoses    Annual physical exam    -  Primary    Age-appropriate health guidance provided.       Return in about 1 year (around 01/15/2018) for wellness exam, sooner depending on lab results.   Fernande Bras, PA-C Primary Care at [facility]

## 2017-01-15 NOTE — Assessment & Plan Note (Signed)
Currently stable without treatment

## 2017-01-15 NOTE — Assessment & Plan Note (Signed)
Recommend continued healthy lifestyle choices.

## 2017-01-15 NOTE — Patient Instructions (Addendum)
[facility]  [address] [place], [place] [address]  Phone: [phone]  [facility] [place], [place], [place] [address]  Phone: [phone]     IF you received an x-ray today, you will receive an invoice from [facility]. Please contact [facility] at [phone] with questions or concerns regarding your invoice.   IF you received labwork today, you will receive an invoice from [facility]. Please contact [facility] at [phone] with questions or concerns regarding your invoice.   Our billing staff will not be able to assist you with questions regarding bills from these companies.  You will be contacted with the lab results as soon as they are available. The fastest way to get your results is to activate your My Chart account. Instructions are located on the last page of this paperwork. If you have not heard from [place] regarding the results in 2 weeks, please contact this office.     Preventive Care 18-39 Years, Male Preventive care refers to lifestyle choices and visits with your health care provider that can promote health and wellness. What does preventive care include?  A yearly physical exam. This is also called an annual well check.  Dental exams once or twice a year.  Routine eye exams. Ask your health care provider how often you should have your eyes checked.  Personal lifestyle choices, including: ? Daily care of your teeth and gums. ? Regular physical activity. ? Eating a healthy diet. ? Avoiding tobacco and drug use. ? Limiting alcohol use. ? Practicing safe sex. What happens during an annual well check? The services and screenings done by your health care provider during your annual well check will depend on your age, overall health, lifestyle risk factors, and family history of disease. Counseling Your health care provider may ask you questions about your:  Alcohol use.  Tobacco use.  Drug use.  Emotional well-being.  Home  and relationship well-being.  Sexual activity.  Eating habits.  Work and work Statistician.  Screening You may have the following tests or measurements:  Height, weight, and BMI.  Blood pressure.  Lipid and cholesterol levels. These may be checked every 5 years starting at age 72.  Diabetes screening. This is done by checking your blood sugar (glucose) after you have not eaten for a while (fasting).  Skin check.  Hepatitis C blood test.  Hepatitis B blood test.  Sexually transmitted disease (STD) testing.  Discuss your test results, treatment options, and if necessary, the need for more tests with your health care provider. Vaccines Your health care provider may recommend certain vaccines, such as:  Influenza vaccine. This is recommended every year.  Tetanus, diphtheria, and acellular pertussis (Tdap, Td) vaccine. You may need a Td booster every 10 years.  Varicella vaccine. You may need this if you have not been vaccinated.  HPV vaccine. If you are 74 or younger, you may need three doses over 6 months.  Measles, mumps, and rubella (MMR) vaccine. You may need at least one dose of MMR.You may also need a second dose.  Pneumococcal 13-valent conjugate (PCV13) vaccine. You may need this if you have certain conditions and have not been vaccinated.  Pneumococcal polysaccharide (PPSV23) vaccine. You may need one or two doses if you smoke cigarettes or if you have certain conditions.  Meningococcal vaccine. One dose is recommended if you are age 84-21 years and a first-year college student living in a residence hall, or if you have one of several  medical conditions. You may also need additional booster doses.  Hepatitis A vaccine. You may need this if you have certain conditions or if you travel or work in places where you may be exposed to hepatitis A.  Hepatitis B vaccine. You may need this if you have certain conditions or if you travel or work in places where you may be  exposed to hepatitis B.  Haemophilus influenzae type b (Hib) vaccine. You may need this if you have certain risk factors.  Talk to your health care provider about which screenings and vaccines you need and how often you need them. This information is not intended to replace advice given to you by your health care provider. Make sure you discuss any questions you have with your health care provider. Document Released: 04/20/2001 Document Revised: 11/12/2015 Document Reviewed: 12/24/2014 Elsevier Interactive Patient Education  2017 [employer].

## 2017-01-16 LAB — URINALYSIS, DIPSTICK ONLY
BILIRUBIN UA: NEGATIVE
GLUCOSE, UA: NEGATIVE
Ketones, UA: NEGATIVE
LEUKOCYTES UA: NEGATIVE
Nitrite, UA: NEGATIVE
PROTEIN UA: NEGATIVE
RBC, UA: NEGATIVE
Specific Gravity, UA: 1.02 (ref 1.005–1.030)
Urobilinogen, Ur: 0.2 mg/dL (ref 0.2–1.0)
pH, UA: 6.5 (ref 5.0–7.5)

## 2017-01-19 ENCOUNTER — Telehealth: Payer: Self-pay | Admitting: Physician Assistant

## 2017-01-19 NOTE — Telephone Encounter (Signed)
Pt wants lab results [phone]

## 2017-01-19 NOTE — Telephone Encounter (Signed)
Please review and release labs to MyChart. Thanks!

## 2017-01-20 NOTE — Telephone Encounter (Signed)
[name] wants the patient to come back in for a LAB ONLY visit to draw a  PTH (Intact).  I left a message for the patient on 01-20-17 @ 4:00pm  Shay

## 2017-01-20 NOTE — Addendum Note (Signed)
Addended by: Baldwin CrownJOHNSON, SHAQUETTA D on: 01/20/2017 04:07 PM   Modules accepted: Orders

## 2017-01-23 LAB — CBC WITH DIFFERENTIAL/PLATELET
BASOS: 0 %
Basophils Absolute: 0 10*3/uL (ref 0.0–0.2)
EOS (ABSOLUTE): 0.2 10*3/uL (ref 0.0–0.4)
EOS: 2 %
HEMATOCRIT: 45.9 % (ref 37.5–51.0)
Hemoglobin: 15.3 g/dL (ref 13.0–17.7)
IMMATURE GRANS (ABS): 0 10*3/uL (ref 0.0–0.1)
IMMATURE GRANULOCYTES: 0 %
Lymphocytes Absolute: 1.8 10*3/uL (ref 0.7–3.1)
Lymphs: 25 %
MCH: 27.8 pg (ref 26.6–33.0)
MCHC: 33.3 g/dL (ref 31.5–35.7)
MCV: 83 fL (ref 79–97)
Monocytes Absolute: 0.6 10*3/uL (ref 0.1–0.9)
Monocytes: 8 %
Neutrophils Absolute: 4.6 10*3/uL (ref 1.4–7.0)
Neutrophils: 65 %
Platelets: 238 10*3/uL (ref 150–379)
RBC: 5.51 x10E6/uL (ref 4.14–5.80)
RDW: 13.5 % (ref 12.3–15.4)
WBC: 7.3 10*3/uL (ref 3.4–10.8)

## 2017-01-23 LAB — COMPREHENSIVE METABOLIC PANEL
A/G RATIO: 1.7 (ref 1.2–2.2)
ALT: 47 IU/L — AB (ref 0–44)
AST: 20 IU/L (ref 0–40)
Albumin: 4.5 g/dL (ref 3.5–5.5)
Alkaline Phosphatase: 99 IU/L (ref 39–117)
BUN/Creatinine Ratio: 15 (ref 9–20)
BUN: 12 mg/dL (ref 6–20)
Bilirubin Total: 0.7 mg/dL (ref 0.0–1.2)
CALCIUM: 11.2 mg/dL — AB (ref 8.7–10.2)
CO2: 23 mmol/L (ref 20–29)
Chloride: 102 mmol/L (ref 96–106)
Creatinine, Ser: 0.82 mg/dL (ref 0.76–1.27)
GFR calc Af Amer: 135 mL/min/{1.73_m2} (ref >59)
GFR, EST NON AFRICAN AMERICAN: 117 mL/min/{1.73_m2} (ref >59)
GLOBULIN, TOTAL: 2.6 g/dL (ref 1.5–4.5)
Glucose: 90 mg/dL (ref 65–99)
POTASSIUM: 4.1 mmol/L (ref 3.5–5.2)
SODIUM: 138 mmol/L (ref 134–144)
TOTAL PROTEIN: 7.1 g/dL (ref 6.0–8.5)

## 2017-01-23 LAB — HEMOGLOBIN A1C
Est. average glucose Bld gHb Est-mCnc: 111 mg/dL
Hgb A1c MFr Bld: 5.5 % (ref 4.8–5.6)

## 2017-01-23 LAB — VITAMIN D 1,25 DIHYDROXY
VITAMIN D 1, 25 (OH) TOTAL: 44 pg/mL
Vitamin D2 1, 25 (OH)2: <10 pg/mL
Vitamin D3 1, 25 (OH)2: 44 pg/mL

## 2017-01-23 LAB — LIPID PANEL
CHOLESTEROL TOTAL: 161 mg/dL (ref 100–199)
Chol/HDL Ratio: 5.2 ratio — ABNORMAL HIGH (ref 0.0–5.0)
HDL: 31 mg/dL — AB (ref >39)
LDL CALC: 113 mg/dL — AB (ref 0–99)
Triglycerides: 86 mg/dL (ref 0–149)
VLDL CHOLESTEROL CAL: 17 mg/dL (ref 5–40)

## 2017-01-31 ENCOUNTER — Ambulatory Visit (INDEPENDENT_AMBULATORY_CARE_PROVIDER_SITE_OTHER): Payer: BLUE CROSS/BLUE SHIELD | Admitting: Family Medicine

## 2017-01-31 ENCOUNTER — Telehealth: Payer: Self-pay

## 2017-01-31 NOTE — Telephone Encounter (Signed)
Pylera sent for patient's positive H Pylori not covered by patient's insurance.  Please advise regarding alternative.

## 2017-01-31 NOTE — Telephone Encounter (Signed)
Break down into generic components 

## 2017-02-01 LAB — PTH, INTACT AND CALCIUM
CALCIUM: 10.7 mg/dL — AB (ref 8.7–10.2)
PTH: 42 pg/mL (ref 15–65)

## 2017-02-02 NOTE — Progress Notes (Signed)
Lab visit only. 

## 2017-02-04 NOTE — Telephone Encounter (Signed)
Spoke with patient and told him that per the pharmacy his insurance did not cover Pylera.  I told him I would send in the generic version.  Patient agreed.  Spoke to pharmacy and called in breakdown as follows:  pepto bismol 2 tab bid for 10 days; metronidazole 250mg  bid for 10 days; doxycycline 100mg  bid for 10 days.

## 2017-03-10 ENCOUNTER — Telehealth: Payer: Self-pay

## 2017-03-10 NOTE — Telephone Encounter (Signed)
[facility] faxed request for Omeprazole dr 40 mg, last filled 12/13/2016, pt has appt 03/21/17

## 2017-03-21 ENCOUNTER — Ambulatory Visit: Payer: BLUE CROSS/BLUE SHIELD | Admitting: Family Medicine

## 2017-03-21 ENCOUNTER — Other Ambulatory Visit: Payer: Self-pay

## 2017-03-21 DIAGNOSIS — K2970 Gastritis, unspecified, without bleeding: Secondary | ICD-10-CM | POA: Diagnosis not present

## 2017-03-21 DIAGNOSIS — R82994 Hypercalciuria: Secondary | ICD-10-CM | POA: Diagnosis not present

## 2017-03-21 DIAGNOSIS — K2990 Gastroduodenitis, unspecified, without bleeding: Secondary | ICD-10-CM

## 2017-03-21 MED ORDER — OMEPRAZOLE 40 MG PO CPDR: 40.0000 mg | DELAYED_RELEASE_CAPSULE | Freq: Two times a day (BID) | ORAL | 3 refills | Status: DC

## 2017-03-21 NOTE — Progress Notes (Signed)
Subjective:  By signing my name below, I, Stann Ore, attest that this documentation has been prepared under the direction and in the presence of [name], MD. Electronically Signed: Stann Ore, Scribe. 03/21/2017 , 1:51 PM .  Patient was seen in Room 1 .   Patient ID: Larry Hayden, male    DOB: [birth year], 32 y.o.   MRN: [id number] Chief Complaint  Patient presents with  . Follow-up    blood work to check calcuim   HPI Larry Hayden is a 32 y.o. male who presents to Primary Care at [facility] for follow up. Patient was seen for his CPE 2 months prior by my partner, Porfirio Oar, PA-C. His calcium was mildly elevated at that time, Vitamin D was normal at 44, PTH was normal reflecting non parathyroid hypercalcemia. He has been by endocrinologist, Dr. Elvera Lennox, for this with last visit in April 2016. At that point, his corresponding PTH was quite elevated, and he had a Vitamin D deficiency; no complications from the hypercalcemia. 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 FHH is usually mild and needs no treatment. He had a sestamibi scan for parathyroid tumor, a 24 hour urine calcium was low-normal, but done in a presence of Vitamin D deficiency, so needed to be recollected when vitamin D was replaced. After it was normal, she recommended recheck calcium, intact PTH, magnesium, phosphorous, vitamin D 25 and 125; and repeat 24 hour urine calcium to creatinine ratio.   Patient states he's doing well. He's still taking Vitamin D 5000 units QD, omeprazole 40mg  and flax seed. He was having abdominal pain 1 month ago, but this was resolved. He's stopped smoking for a year now. He denies any urinary or bowel symptoms.   Past Medical History:  Diagnosis Date  . Acne   . History of hypercalcemia 2015  . Hyperactive gag reflex   . Hyperparathyroidism (HCC)   . Pilonidal sinus 04/2015  . Tooth injury    right upper front - hx. of repair  . Vitamin D deficiency     Past Surgical History:  Procedure Laterality Date  . NO PAST SURGERIES    . PILONIDAL CYST EXCISION N/A 04/23/2015   Procedure: CYST EXCISION PILONIDAL EXTENSIVE;  Surgeon: Almond Lint, MD;  Location: [facility];  Service: General;  Laterality: N/A;   Prior to Admission medications   Medication Sig Start Date End Date Taking? Authorizing Provider  Cholecalciferol (VITAMIN D3) 5000 units CAPS Take 1 capsule (5,000 Units total) by mouth daily. 11/24/16   Sherren Mocha, MD  omeprazole (PRILOSEC) 40 MG capsule Take 1 capsule (40 mg total) by mouth 2 (two) times daily before a meal. 12/13/16   Sherren Mocha, MD  Probiotic Product (ADVANCED PROBIOTIC) CAPS Take 1 capsule by mouth daily. 12/13/16   Sherren Mocha, MD   No Known Allergies Family History  Problem Relation Age of Onset  . Cancer Mother   . Heart disease Maternal Grandmother    Social History   Socioeconomic History  . Marital status: Married    Spouse name: Not on file  . Number of children: 0  . Years of education: College  . Highest education level: Not on file  Social Needs  . Financial resource strain: Not on file  . Food insecurity - worry: Not on file  . Food insecurity - inability: Not on file  . Transportation needs - medical: Not on file  . Transportation needs - non-medical: Not on file  Occupational  History  . Occupation: Clinical biochemist: [employer]  Tobacco Use  . Smoking status: Former Smoker    Packs/day: 1.00    Years: 13.00    Pack years: 13.00    Types: Cigarettes    Last attempt to quit: 07/28/2015    Years since quitting: 1.6  . Smokeless tobacco: Never Used  Substance and Sexual Activity  . Alcohol use: No    Alcohol/week: 0.0 oz    Frequency: Never  . Drug use: No  . Sexual activity: Not on file  Other Topics Concern  . Not on file  Social History Narrative   Originally from [place].   Came to the [place] in 2014.   Has a degree in Public relations account executive, and served in the  [employer].   Married summer 2018. His wife lives in [place].   Lives with his sister.   Depression screen [facility] 2/9 12/28/2016 12/13/2016 11/24/2016 11/17/2016 06/25/2016  Decreased Interest 0 0 0 0 0  Down, Depressed, Hopeless 0 0 0 0 0  PHQ - 2 Score 0 0 0 0 0  Altered sleeping - - - - -  Tired, decreased energy - - - - -  Change in appetite - - - - -  Feeling bad or failure about yourself  - - - - -  Trouble concentrating - - - - -  Moving slowly or fidgety/restless - - - - -  Suicidal thoughts - - - - -  PHQ-9 Score - - - - -    Review of Systems  Constitutional: Negative for fatigue and unexpected weight change.  Eyes: Negative for visual disturbance.  Respiratory: Negative for cough, chest tightness and shortness of breath.   Cardiovascular: Negative for chest pain, palpitations and leg swelling.  Gastrointestinal: Negative for abdominal pain and blood in stool.  Neurological: Negative for dizziness, light-headedness and headaches.       Objective:   Physical Exam  Constitutional: He is oriented to person, place, and time. He appears well-developed and well-nourished. No distress.  HENT:  Head: Normocephalic and atraumatic.  Eyes: EOM are normal. Pupils are equal, round, and reactive to light.  Neck: Neck supple.  Cardiovascular: Normal rate, regular rhythm and normal heart sounds.  Pulmonary/Chest: Effort normal and breath sounds normal. No respiratory distress.  Musculoskeletal: Normal range of motion.  Neurological: He is alert and oriented to person, place, and time.  Skin: Skin is warm and dry.  Psychiatric: He has a normal mood and affect. His behavior is normal.  Nursing note and vitals reviewed.   BP 122/66   Pulse 76   Temp 98 F (36.7 C) (Oral)   Resp 16   Ht 5\' 9"  (1.753 m)   Wt 237 lb (107.5 kg)   SpO2 98%   BMI 35.00 kg/m      Assessment & Plan:   1. Hypercalcemia - seen by Dr. Elvera Lennox for this prior who highly suspects pt has a mild form of  familial hypocalciuric hypercalcemia (does not need trx). However - need to confirm urine calcium (FECa) still in line with this now that his vitamin D is repleted.  2. Gastritis and gastroduodenitis - dramatically better since completed H. Pylori treatmet. However, sxs were so severe I think he should stay on the ppi for the time being - pt very agreeable    Orders Placed This Encounter  Procedures  . PTH, Intact and Calcium  . Calcium, Urine, 24 Hour  Standing Status:   Future    Number of Occurrences:   1    Standing Expiration Date:   03/21/2018  . Basic metabolic panel    Order Specific Question:   Has the patient fasted?    Answer:   No  . Magnesium  . Phosphorus  . VITAMIN D 25 Hydroxy (Vit-D Deficiency, Fractures)  . Vitamin D 1,25 dihydroxy    Meds ordered this encounter  Medications  . omeprazole (PRILOSEC) 40 MG capsule    Sig: Take 1 capsule (40 mg total) by mouth 2 (two) times daily before a meal.    Dispense:  180 capsule    Refill:  3    I personally performed the services described in this documentation, which was scribed in my presence. The recorded information has been reviewed and considered, and addended by me as needed.   Norberto SorensonEva Nelia Rogoff, M.D.  Primary Care at [facility]  [place] [address] [place], [place] [address] [phone] phone [phone] fax  04/03/17 11:52 PM

## 2017-03-21 NOTE — Patient Instructions (Addendum)
   IF you received an x-ray today, you will receive an invoice from [facility]. Please contact [facility] at [phone] with questions or concerns regarding your invoice.   IF you received labwork today, you will receive an invoice from [facility]. Please contact [facility] at [phone] with questions or concerns regarding your invoice.   Our billing staff will not be able to assist you with questions regarding bills from these companies.  You will be contacted with the lab results as soon as they are available. The fastest way to get your results is to activate your My Chart account. Instructions are located on the last page of this paperwork. If you have not heard from us regarding the results in 2 weeks, please contact this office.    Hypercalcemia Hypercalcemia is having too much calcium in the blood. The body needs calcium to make bones and keep them strong. Calcium also helps the muscles, nerves, brain, and heart work the way they should. Most of the calcium in the body is in the bones. There is also some calcium in the blood. Hypercalcemia can happen when calcium comes out of the bones, or when the kidneys are not able to remove calcium from the blood. Hypercalcemia can be mild or severe. What are the causes? There are many possible causes of hypercalcemia. Common causes include:  Hyperparathyroidism. This is a condition in which the body produces too much parathyroid hormone. There are four parathyroid glands in your neck. These glands produce a chemical messenger (hormone) that helps the body absorb calcium from foods and helps your bones release calcium.  Certain kinds of cancer, such as lung cancer, breast cancer, or myeloma.  Less common causes of hypercalcemia include:  Getting too much calcium or vitamin D from your diet.  Kidney failure.  Hyperthyroidism.  Being on bed rest for a long time.  Certain  medicines.  Infections.  Sarcoidosis.  What increases the risk? This condition is more likely to develop in:  Women.  People who are 60 years or older.  People who have a family history of hypercalcemia.  What are the signs or symptoms? Mild hypercalcemia that starts slowly may not cause symptoms. Severe, sudden hypercalcemia is more likely to cause symptoms, such as:  Loss of appetite.  Increased thirst and frequent urination.  Fatigue.  Nausea and vomiting.  Headache.  Abdominal pain.  Muscle pain, twitching, or weakness.  Constipation.  Blood in the urine.  Pain in the side of the back (flank pain).  Anxiety, confusion, or depression.  Irregular heartbeat (arrhythmia).  Loss of consciousness.  How is this diagnosed? This condition may be diagnosed based on:  Your symptoms.  Blood tests.  Urine tests.  X-rays.  Ultrasound.  MRI.  CT scan.  How is this treated? Treatment for hypercalcemia depends on the cause. Treatment may include:  Receiving fluids through an IV tube.  Medicines that keep calcium levels steady after receiving fluids (loop diuretics).  Medicines that keep calcium in your bones (bisphosphonates).  Medicines that lower the calcium level in your blood.  Surgery to remove overactive parathyroid glands.  Follow these instructions at home:  Take over-the-counter and prescription medicines only as told by your health care provider.  Follow instructions from your health care provider about eating or drinking restrictions.  Drink enough fluid to keep your urine clear or pale yellow.  Stay active. Weight-bearing exercise helps to keep calcium in your bones. Follow instructions from your health care provider about what   type and level of exercise is safe for you.  Keep all follow-up visits as told by your health care provider. This is important. Contact a health care provider if:  You have a fever.  You have flank or  abdominal pain that is getting worse. Get help right away if:  You have severe abdominal or flank pain.  You have chest pain.  You have trouble breathing.  You become very confused and sleepy.  You lose consciousness. This information is not intended to replace advice given to you by your health care provider. Make sure you discuss any questions you have with your health care provider. Document Released: 05/08/2004 Document Revised: 07/31/2015 Document Reviewed: 07/10/2014 Elsevier Interactive Patient Education  2018 [employer]  

## 2017-03-25 LAB — VITAMIN D 25 HYDROXY (VIT D DEFICIENCY, FRACTURES): Vit D, 25-Hydroxy: 31.7 ng/mL (ref 30.0–100.0)

## 2017-03-25 LAB — BASIC METABOLIC PANEL
BUN / CREAT RATIO: 16 (ref 9–20)
BUN: 12 mg/dL (ref 6–20)
CO2: 22 mmol/L (ref 20–29)
CREATININE: 0.73 mg/dL — AB (ref 0.76–1.27)
Calcium: 10.6 mg/dL — ABNORMAL HIGH (ref 8.7–10.2)
Chloride: 104 mmol/L (ref 96–106)
GFR, EST AFRICAN AMERICAN: 142 mL/min/{1.73_m2} (ref >59)
GFR, EST NON AFRICAN AMERICAN: 123 mL/min/{1.73_m2} (ref >59)
Glucose: 171 mg/dL — ABNORMAL HIGH (ref 65–99)
Potassium: 3.9 mmol/L (ref 3.5–5.2)
SODIUM: 141 mmol/L (ref 134–144)

## 2017-03-25 LAB — VITAMIN D 1,25 DIHYDROXY
VITAMIN D 1, 25 (OH) TOTAL: 52 pg/mL
Vitamin D3 1, 25 (OH)2: 52 pg/mL

## 2017-03-25 LAB — PTH, INTACT AND CALCIUM: PTH: 43 pg/mL (ref 15–65)

## 2017-03-25 LAB — PHOSPHORUS: Phosphorus: 1.7 mg/dL — ABNORMAL LOW (ref 2.5–4.5)

## 2017-03-25 LAB — MAGNESIUM: Magnesium: 2 mg/dL (ref 1.6–2.3)

## 2017-03-29 LAB — CALCIUM, URINE, 24 HOUR
CALCIUM 24H UR: 374 mg/(24.h) — AB (ref 100.0–300.0)
CALCIUM, URINE: 18.7 mg/dL

## 2017-04-04 DIAGNOSIS — K2990 Gastroduodenitis, unspecified, without bleeding: Secondary | ICD-10-CM

## 2017-04-04 DIAGNOSIS — K2970 Gastritis, unspecified, without bleeding: Secondary | ICD-10-CM | POA: Insufficient documentation

## 2017-04-04 MED ORDER — PHOSPHA 250 NEUTRAL 155-852-130 MG PO TABS: 2.0000 | ORAL_TABLET | Freq: Two times a day (BID) | ORAL | 0 refills | Status: DC

## 2017-04-12 ENCOUNTER — Telehealth: Payer: Self-pay

## 2017-04-12 NOTE — Telephone Encounter (Signed)
[facility] sent [facility] refill request for Phospha 250. Is patient to continue taking this medication? Please Advise

## 2017-04-14 NOTE — Telephone Encounter (Signed)
No - is not supposed to continue after course is done.

## 2017-04-19 ENCOUNTER — Telehealth: Payer: Self-pay

## 2017-04-19 NOTE — Telephone Encounter (Signed)
[facility] notified of MD notes

## 2017-05-15 ENCOUNTER — Other Ambulatory Visit: Payer: Self-pay | Admitting: Family Medicine

## 2017-05-16 NOTE — Telephone Encounter (Signed)
LOV 03/21/2017 with Dr. Clelia CroftShaw / Refill request for Vitamin D / Refilled per protocol

## 2017-06-02 ENCOUNTER — Encounter: Payer: Self-pay | Admitting: Internal Medicine

## 2017-06-07 DIAGNOSIS — L700 Acne vulgaris: Secondary | ICD-10-CM | POA: Diagnosis not present

## 2017-08-03 ENCOUNTER — Encounter: Payer: Self-pay | Admitting: Physician Assistant

## 2017-08-03 ENCOUNTER — Ambulatory Visit: Payer: BLUE CROSS/BLUE SHIELD | Admitting: Physician Assistant

## 2017-08-03 ENCOUNTER — Other Ambulatory Visit: Payer: Self-pay

## 2017-08-03 VITALS — BP 132/74 | HR 90 | Temp 99.4°F | Resp 16 | Ht 69.09 in | Wt 243.8 lb

## 2017-08-03 DIAGNOSIS — R5383 Other fatigue: Secondary | ICD-10-CM

## 2017-08-03 DIAGNOSIS — F17200 Nicotine dependence, unspecified, uncomplicated: Secondary | ICD-10-CM | POA: Diagnosis not present

## 2017-08-03 DIAGNOSIS — J029 Acute pharyngitis, unspecified: Secondary | ICD-10-CM

## 2017-08-03 DIAGNOSIS — R05 Cough: Secondary | ICD-10-CM

## 2017-08-03 DIAGNOSIS — R739 Hyperglycemia, unspecified: Secondary | ICD-10-CM

## 2017-08-03 DIAGNOSIS — R059 Cough, unspecified: Secondary | ICD-10-CM

## 2017-08-03 LAB — POCT CBC
GRANULOCYTE PERCENT: 65.1 % (ref 37–80)
HEMATOCRIT: 48.7 % (ref 43.5–53.7)
Hemoglobin: 15.7 g/dL (ref 14.1–18.1)
Lymph, poc: 2 (ref 0.6–3.4)
MCH, POC: 26.9 pg — AB (ref 27–31.2)
MCHC: 32.3 g/dL (ref 31.8–35.4)
MCV: 83.2 fL (ref 80–97)
MID (CBC): 0.3 (ref 0–0.9)
MPV: 8.8 fL (ref 0–99.8)
PLATELET COUNT, POC: 202 10*3/uL (ref 142–424)
POC GRANULOCYTE: 4.4 (ref 2–6.9)
POC LYMPH %: 29.8 % (ref 10–50)
POC MID %: 5.1 % (ref 0–12)
RBC: 5.84 M/uL (ref 4.69–6.13)
RDW, POC: 13.4 %
WBC: 6.7 10*3/uL (ref 4.6–10.2)

## 2017-08-03 LAB — POCT GLYCOSYLATED HEMOGLOBIN (HGB A1C): HEMOGLOBIN A1C: 5.8 % — AB (ref 4.0–5.6)

## 2017-08-03 LAB — GLUCOSE, POCT (MANUAL RESULT ENTRY): POC Glucose: 83 mg/dl (ref 70–99)

## 2017-08-03 MED ORDER — BENZONATATE 100 MG PO CAPS: 100.0000 mg | ORAL_CAPSULE | Freq: Three times a day (TID) | ORAL | 0 refills | Status: DC | PRN

## 2017-08-03 MED ORDER — GUAIFENESIN ER 1200 MG PO TB12: 1.0000 | ORAL_TABLET | Freq: Two times a day (BID) | ORAL | 1 refills | Status: DC | PRN

## 2017-08-03 MED ORDER — LORATADINE 10 MG PO TABS: 10.0000 mg | ORAL_TABLET | Freq: Every day | ORAL | 0 refills | Status: DC

## 2017-08-03 MED ORDER — AZELASTINE HCL 0.1 % NA SOLN: 2.0000 | Freq: Two times a day (BID) | NASAL | 0 refills | Status: DC

## 2017-08-03 NOTE — Progress Notes (Signed)
Patient ID: Larry Hayden, male    DOB: September 28, 1985, 32 y.o.   MRN: [id number]  PCP: Sherren Mocha, MD  Chief Complaint  Patient presents with  . Sore Throat    since last Thursday , no appetite, feels weak     Subjective:   Presents for evaluation of sore throat and generalized malaise.  "My body feels lazy." Pain with swallowing, loss of appetite. Cough productive of yellow/green mucous. No nasal congestion, runny nose.  [place] the pharmacist would recommend Keflex or Amoxicillin for these symptoms.  Subjective fever. Headache, "heavy," across the forehead. No ear pain. No nausea, vomiting or diarrhea.   Review of Systems As above    Patient Active Problem List   Diagnosis Date Noted  . Gastritis and gastroduodenitis 04/04/2017  . Episodic mood disorder (HCC) 01/15/2017  . BMI 34.0-34.9,adult 11/19/2016  . Prediabetes 09/17/2016  . Hyperparathyroidism (HCC) 12/17/2013  . Vitamin D deficiency 12/17/2013  . Hypercalcemia 06/04/2013  . Stiffness of hand joint 06/04/2013  . Smoker 05/11/2013  . Hyperlipidemia 03/30/2013  . Elevated BP without diagnosis of hypertension 02/16/2013  . Acne 02/16/2013     Prior to Admission medications   Medication Sig Start Date End Date Taking? Authorizing Provider  [facility] D3 5000 units capsule TAKE 1 CAPSULE (5,000 UNITS TOTAL) BY MOUTH DAILY. 05/16/17  Yes Sherren Mocha, MD  omeprazole (PRILOSEC) 40 MG capsule Take 1 capsule (40 mg total) by mouth 2 (two) times daily before a meal. 03/21/17  Yes Sherren Mocha, MD     No Known Allergies     Objective:  Physical Exam  Constitutional: He is oriented to person, place, and time. He appears well-developed and well-nourished. No distress.  BP 132/74   Pulse 90   Temp 99.4 F (37.4 C)   Resp 16   Ht 5' 9.09" (1.755 m)   Wt 243 lb 12.8 oz (110.6 kg)   SpO2 98%   BMI 35.91 kg/m    HENT:  Head: Normocephalic and atraumatic.  Right Ear: Hearing, tympanic  membrane, external ear and ear canal normal.  Left Ear: Hearing, tympanic membrane, external ear and ear canal normal.  Nose: Mucosal edema and rhinorrhea present.  No foreign bodies. Right sinus exhibits no maxillary sinus tenderness and no frontal sinus tenderness. Left sinus exhibits no maxillary sinus tenderness and no frontal sinus tenderness.  Mouth/Throat: Uvula is midline and mucous membranes are normal. No uvula swelling. Posterior oropharyngeal erythema (thick yellow mucous in the posterior pharynx) present. No oropharyngeal exudate, posterior oropharyngeal edema or tonsillar abscesses.  Eyes: Pupils are equal, round, and reactive to light. Conjunctivae and EOM are normal. Right eye exhibits no discharge. Left eye exhibits no discharge. No scleral icterus.  Neck: Trachea normal, normal range of motion and full passive range of motion without pain. Neck supple. No thyroid mass and no thyromegaly present.  Cardiovascular: Normal rate, regular rhythm and normal heart sounds.  Pulmonary/Chest: Effort normal and breath sounds normal.  Lymphadenopathy:       Head (right side): No submandibular, no tonsillar, no preauricular, no posterior auricular and no occipital adenopathy present.       Head (left side): No submandibular, no tonsillar, no preauricular and no occipital adenopathy present.    He has no cervical adenopathy.       Right: No supraclavicular adenopathy present.       Left: No supraclavicular adenopathy present.  Neurological: He is alert and oriented to  person, place, and time. He has normal strength. No cranial nerve deficit or sensory deficit.  Skin: Skin is warm, dry and intact. No rash noted.  Psychiatric: He has a normal mood and affect. His speech is normal and behavior is normal.   Results for orders placed or performed in visit on 08/03/17  POCT CBC  Result Value Ref Range   WBC 6.7 4.6 - 10.2 K/uL   Lymph, poc 2.0 0.6 - 3.4   POC LYMPH PERCENT 29.8 10 - 50 %L   MID  (cbc) 0.3 0 - 0.9   POC MID % 5.1 0 - 12 %M   POC Granulocyte 4.4 2 - 6.9   Granulocyte percent 65.1 37 - 80 %G   RBC 5.84 4.69 - 6.13 M/uL   Hemoglobin 15.7 14.1 - 18.1 g/dL   HCT, POC 13.0 86.5 - 53.7 %   MCV 83.2 80 - 97 fL   MCH, POC 26.9 (A) 27 - 31.2 pg   MCHC 32.3 31.8 - 35.4 g/dL   RDW, POC 78.4 %   Platelet Count, POC 202 142 - 424 K/uL   MPV 8.8 0 - 99.8 fL  POCT glucose (manual entry)  Result Value Ref Range   POC Glucose 83 70 - 99 mg/dl  POCT glycosylated hemoglobin (Hb A1C)  Result Value Ref Range   Hemoglobin A1C 5.8 (A) 4.0 - 5.6 %   HbA1c, POC (prediabetic range)  5.7 - 6.4 %   HbA1c, POC (controlled diabetic range)  0.0 - 7.0 %      Assessment & Plan:   1. Sore throat Doubt strep throat.  Patient desires an antibiotic.  Counseled on appropriate antibiotic use.  Anticipatory guidance provided.  Supportive care. - azelastine (ASTELIN) 0.1 % nasal spray; Place 2 sprays into both nostrils 2 (two) times daily. Use in each nostril as directed  Dispense: 30 mL; Refill: 0 - Guaifenesin (MUCINEX MAXIMUM STRENGTH) 1200 MG TB12; Take 1 tablet (1,200 mg total) by mouth every 12 (twelve) hours as needed.  Dispense: 14 tablet; Refill: 1 - loratadine (CLARITIN) 10 MG tablet; Take 1 tablet (10 mg total) by mouth daily.  Dispense: 30 tablet; Refill: 0  2. Fatigue, unspecified type Advised the patient that fatigue can come from lifestyle and his chronic underlying problems, including vitamin D deficiency, prediabetes and mood disorder.  Recent increase is likely due to current viral illness. - POCT CBC - POCT glucose (manual entry) - POCT urinalysis dipstick - TSH - Comprehensive metabolic panel  3. Cough - benzonatate (TESSALON) 100 MG capsule; Take 1-2 capsules (100-200 mg total) by mouth 3 (three) times daily as needed for cough.  Dispense: 40 capsule; Refill: 0  4. Hyperglycemia - POCT glycosylated hemoglobin (Hb A1C)  5. Smoker Smoking cessation  encouraged    Return if symptoms worsen or fail to improve.   Fernande Bras, PA-C Primary [facility] at [facility]

## 2017-08-03 NOTE — Patient Instructions (Addendum)
IF you received an x-ray today, you will receive an invoice from [facility]. Please contact [facility] at [phone] with questions or concerns regarding your invoice.   IF you received labwork today, you will receive an invoice from [facility]. Please contact [facility] at [phone] with questions or concerns regarding your invoice.   Our billing staff will not be able to assist you with questions regarding bills from these companies.  You will be contacted with the lab results as soon as they are available. The fastest way to get your results is to activate your My Chart account. Instructions are located on the last page of this paperwork. If you have not heard from [place] regarding the results in 2 weeks, please contact this office.     Antibiotics Aren't Always The Answer  CDC Urges Public To Be Antibiotics Aware The Centers for Disease Control and Prevention (CDC) encourages patients and families to Be Antibiotics Aware by learning about safe antibiotic use. Each year in the [place], at least 2 million people get infected with antibiotic-resistant bacteria. At least 23,000 die as a result. Antibiotic resistance, one of the most urgent threats to the public's health, occurs when bacteria develop the ability to defeat the drugs designed to kill them.  What Do Antibiotics Treat? When you get a prescription for antibiotics, follow your doctor's instructions carefully.  Antibiotics are critical tools for treating a number of common infections, such as pneumonia, and for life-threatening conditions including sepsis. Antibiotics are only needed for treating certain infections caused by bacteria.  What Don't Antibiotics Treat? Antibiotics do not work on viruses, such as colds and flu, or runny noses, even if the mucus is thick, yellow or green. Antibiotics also won't help some common bacterial infections including most cases of bronchitis, many sinus infections, and  some ear infections.  What Are The Side Effects of Antibiotics? Any time antibiotics are used, they can cause side effects and lead to antibiotic resistance. When antibiotics aren't needed, they won't help you, and the side effects could still hurt you. Common side effects range from things like rashes and yeast infections to severe health problems. More serious side effects include Clostridium difficile infection (also called C. difficile or C. diff), which causes diarrhea that can lead to severe colon damage and death. If you need antibiotics, take them exactly as prescribed. Patients and families can talk to their healthcare professional if they have any questions about their antibiotics, or if they develop side effects, especially diarrhea, since that could be C. difficile, which needs to be treated.  Can I Feel Better Without Antibiotics? Patients and families can ask their healthcare professional about the best way to feel better while their body fights off the virus. Respiratory viruses usually go away in a week or two without treatment.  How Can I Stay Healthy? Regular hand-washing can go a long way toward protecting you from germs.  We can all stay healthy and keep others healthy by cleaning our hands, covering our coughs, staying home when sick, and getting recommended vaccines, for the flu, for example. Antibiotics save lives. When a patient needs antibiotics, the benefits outweigh the risks of side effects and antibiotic resistance. Improving the way we take antibiotics helps keep [place] healthy now, helps fight antibiotic resistance, and ensures that life-saving antibiotics will be available for future generations.  To learn more about antibiotic prescribing and use, visit [email or web address]-[email or web address]    Viral Respiratory Infection A viral respiratory infection is an  illness that affects parts of the body used for breathing, like the lungs, nose, and throat. It is caused by a germ called a  virus. Some examples of this kind of infection are:  A cold.  The flu (influenza).  A respiratory syncytial virus (RSV) infection.  How do I know if I have this infection? Most of the time this infection causes:  A stuffy or runny nose.  Yellow or green fluid in the nose.  A cough.  Sneezing.  Tiredness (fatigue).  Achy muscles.  A sore throat.  Sweating or chills.  A fever.  A headache.  How is this infection treated? If the flu is diagnosed early, it may be treated with an antiviral medicine. This medicine shortens the length of time a person has symptoms. Symptoms may be treated with over-the-counter and prescription medicines, such as:  Expectorants. These make it easier to cough up mucus.  Decongestant nasal sprays.  Doctors do not prescribe antibiotic medicines for viral infections. They do not work with this kind of infection. How do I know if I should stay home? To keep others from getting sick, stay home if you have:  A fever.  A lasting cough.  A sore throat.  A runny nose.  Sneezing.  Muscles aches.  Headaches.  Tiredness.  Weakness.  Chills.  Sweating.  An upset stomach (nausea).  Follow these instructions at home:  Rest as much as possible.  Take over-the-counter and prescription medicines only as told by your doctor.  Drink enough fluid to keep your pee (urine) clear or pale yellow.  Gargle with salt water. Do this 3-4 times per day or as needed. To make a salt-water mixture, dissolve -1 tsp of salt in 1 cup of warm water. Make sure the salt dissolves all the way.  Use nose drops made from salt water. This helps with stuffiness (congestion). It also helps soften the skin around your nose.  Do not drink alcohol.  Do not use tobacco products, including cigarettes, chewing tobacco, and e-cigarettes. If you need help quitting, ask your doctor. Get help if:  Your symptoms last for 10 days or longer.  Your symptoms get  worse over time.  You have a fever.  You have very bad pain in your face or forehead.  Parts of your jaw or neck become very swollen. Get help right away if:  You feel pain or pressure in your chest.  You have shortness of breath.  You faint or feel like you will faint.  You keep throwing up (vomiting).  You feel confused. This information is not intended to replace advice given to you by your health care provider. Make sure you discuss any questions you have with your health care provider. Document Released: 02/05/2008 Document Revised: 07/31/2015 Document Reviewed: 07/31/2014 Elsevier Interactive Patient Education  [employer].  Did you know that you begin to benefit from quitting smoking within the first twenty minutes? It's TRUE.  At 20 minutes: -blood pressure decreases -pulse rate drops -body temperature of hands and feet increases  At 8 hours: -carbon monoxide level in blood drops to normal -oxygen level in blood increases to normal  At 24 hours: -the chance of heart attack decreases  At 48 hours: -nerve endings start regrowing -ability to smell and taste is enhanced  2 weeks-3 months: -circulation improves -walking becomes easier -lung function improves  1-9 months: -coughing, sinus congestion, fatigue and shortness of breath decreases  1 year: -excess risk of heart disease is  decreased to HALF that of a smoker  5 years: Stroke risk is reduced to that of people who have never smoked  10 years: -risk of lung cancer drops to as little as half that of continuing smokers -risk of cancer of the mouth, throat, esophagus, bladder, kidney and pancreas decreases -risk of ulcer decreases  15 years -risk of heart disease is now similar to that of people who have never smoked -risk of death returns to nearly the level of people who have never smoked

## 2017-08-03 NOTE — Assessment & Plan Note (Signed)
Has cut back from 1.5 PPD to 1-2 cigarettes/day.

## 2017-08-04 LAB — COMPREHENSIVE METABOLIC PANEL
ALBUMIN: 4.8 g/dL (ref 3.5–5.5)
ALK PHOS: 79 IU/L (ref 39–117)
ALT: 77 IU/L — ABNORMAL HIGH (ref 0–44)
AST: 44 IU/L — AB (ref 0–40)
Albumin/Globulin Ratio: 2 (ref 1.2–2.2)
BUN / CREAT RATIO: 19 (ref 9–20)
BUN: 15 mg/dL (ref 6–20)
Bilirubin Total: 0.5 mg/dL (ref 0.0–1.2)
CHLORIDE: 101 mmol/L (ref 96–106)
CO2: 26 mmol/L (ref 20–29)
Calcium: 11 mg/dL — ABNORMAL HIGH (ref 8.7–10.2)
Creatinine, Ser: 0.8 mg/dL (ref 0.76–1.27)
GFR calc Af Amer: 136 mL/min/{1.73_m2} (ref >59)
GFR calc non Af Amer: 117 mL/min/{1.73_m2} (ref >59)
GLOBULIN, TOTAL: 2.4 g/dL (ref 1.5–4.5)
GLUCOSE: 95 mg/dL (ref 65–99)
Potassium: 4.5 mmol/L (ref 3.5–5.2)
SODIUM: 140 mmol/L (ref 134–144)
Total Protein: 7.2 g/dL (ref 6.0–8.5)

## 2017-08-04 LAB — TSH: TSH: 1.28 u[IU]/mL (ref 0.450–4.500)

## 2017-08-25 ENCOUNTER — Other Ambulatory Visit: Payer: Self-pay | Admitting: Physician Assistant

## 2017-08-25 DIAGNOSIS — J029 Acute pharyngitis, unspecified: Secondary | ICD-10-CM

## 2017-08-27 ENCOUNTER — Encounter: Payer: Self-pay | Admitting: Family Medicine

## 2017-08-27 ENCOUNTER — Ambulatory Visit (INDEPENDENT_AMBULATORY_CARE_PROVIDER_SITE_OTHER): Payer: BLUE CROSS/BLUE SHIELD

## 2017-08-27 ENCOUNTER — Ambulatory Visit: Payer: BLUE CROSS/BLUE SHIELD | Admitting: Family Medicine

## 2017-08-27 ENCOUNTER — Other Ambulatory Visit: Payer: Self-pay

## 2017-08-27 VITALS — BP 118/80 | HR 90 | Temp 98.0°F | Resp 16 | Ht 69.09 in | Wt 246.0 lb

## 2017-08-27 DIAGNOSIS — M79662 Pain in left lower leg: Secondary | ICD-10-CM

## 2017-08-27 DIAGNOSIS — K2970 Gastritis, unspecified, without bleeding: Secondary | ICD-10-CM | POA: Diagnosis not present

## 2017-08-27 DIAGNOSIS — K2990 Gastroduodenitis, unspecified, without bleeding: Secondary | ICD-10-CM

## 2017-08-27 DIAGNOSIS — M25562 Pain in left knee: Secondary | ICD-10-CM | POA: Diagnosis not present

## 2017-08-27 MED ORDER — HYDROCODONE-ACETAMINOPHEN 5-325 MG PO TABS: 1.0000 | ORAL_TABLET | Freq: Four times a day (QID) | ORAL | 0 refills | Status: DC | PRN

## 2017-08-27 NOTE — Patient Instructions (Addendum)
STOP THE OMEPRAZOLE/PRILOSEC. DO NOT TAKE ANY OVER-THE-COUNTER STOMACH OR ACID REFLUX MEDICATIONS SUCH AS NEXIUM, PROTONIX, ACIPHEX, ETC.   You can call in for a refill on the Mylanta prescriptions liquid you have and use as needed but stop using this completely within 3-5 days of your next office visit in 3 weeks. I suspect you have a recurrence of the bacteria that is causing stomach ulcers, bloating, and pain.  You were treated for this in December and felt GREAT until now so you have probably been re-infected but you have to be off of your stomach medication x 3 weeks for [place] to be able to retest. NO SPICY FOODS, NO VERY ACIDIC FOODS, AVOID CAFFEINE, MINT, TOMATO.   WEAR KNEE COMPRESSION SLEEVE AT ALL TIMES x 3-4 days- UP AND OFF OF YOUR FOOT AT ALL TIMES.  ICE X 20 MINUTES 4 TIMES A DAY.  ANYTIME YOU ARE WALKING, WEAR THE BOOT.  DO THE STRETCH OF YOUR CALF WITH A TOWEL LOOPED AROUND YOUR FOOT 3-4 TIMES A DAY.  Recheck with me on Wed - no work until then.  I think you have torn part of your calf muscle.   IF you received an x-ray today, you will receive an invoice from [facility]. Please contact [facility] at [phone] with questions or concerns regarding your invoice.   IF you received labwork today, you will receive an invoice from [facility]. Please contact [facility] at [phone] with questions or concerns regarding your invoice.   Our billing staff will not be able to assist you with questions regarding bills from these companies.  You will be contacted with the lab results as soon as they are available. The fastest way to get your results is to activate your My Chart account. Instructions are located on the last page of this paperwork. If you have not heard from [place] regarding the results in 2 weeks, please contact this office.     Food Choices for Peptic Ulcer Disease When you have peptic ulcer disease, the foods you eat and your eating habits are very important.  Choosing the right foods can help ease the discomfort of peptic ulcer disease. What general guidelines do I need to follow?  Choose fruits, vegetables, whole grains, and low-fat meat, fish, and poultry.  Keep a food diary to identify foods that cause symptoms.  Avoid foods that cause irritation or pain. These may be different for different people.  Eat frequent small meals instead of three large meals each day. The pain may be worse when your stomach is empty.  Avoid eating close to bedtime. What foods are not recommended? The following are some foods and drinks that may worsen your symptoms:  Black, white, and red pepper.  Hot sauce.  Chili peppers.  Chili powder.  Chocolate and cocoa.  Alcohol.  Tea, coffee, and cola (regular and decaffeinated).  The items listed above may not be a complete list of foods and beverages to avoid. Contact your dietitian for more information. This information is not intended to replace advice given to you by your health care provider. Make sure you discuss any questions you have with your health care provider. Document Released: 05/17/2011 Document Revised: 07/31/2015 Document Reviewed: 12/27/2012 Elsevier Interactive Patient Education  2017 [employer] Inc.    Gastrocnemius Tear Medial head gastrocnemius tear, also called tennis leg, is an injury to the inner part of the calf muscle. This injury may include overstretching of the muscle or a partial or complete tear. This is  a common sports injury. Calf muscle tears usually occur near the back of the knee. This often causes sudden pain and muscle weakness. What are the causes? This condition is caused by forceful stretching or strain on the calf muscle. This usually happens when you forcefully push off of your foot. It may also happen if you forcefully straighten your knee while your foot is flat on the ground. What increases the risk? The following factors may make you more likely to develop this  condition:  Being male and older than age 33.  Playing sports that involve: ? Quick increases in speed and changes of direction, such as tennis and soccer. ? Jumping, such as basketball. ? Running, especially uphill or on uneven ground.  What are the signs or symptoms? Symptoms of this condition include:  Sudden pain in the back of the leg when the injury occurs. You may hear a popping sound or feel like you got hit in your calf.  Pain that is made worse by bringing your toes up toward your shin or by straightening your knee.  Pain on the inside of your calf, from your knee to your ankle.  Not being able to rise up on your toes.  Pain when pressing on your calf muscle.  Swelling of your calf.  Bruising along your calf and lower leg, down to your ankle.  Difficulty pushing off your foot when walking or using stairs.  How is this diagnosed? This condition may be diagnosed based on:  Your symptoms and medical history.  A physical exam. Your health care provider may be able to feel a lump or a defect in your muscle.  MRI or ultrasound to determine the severity and exact location of your injury.  How is this treated? Treatment for this condition may include:  Resting the muscle and keeping weight off your leg for several days. During this time, you may use crutches or another walking device.  Using a splint to keep your ankle or knee in a stable position.  Wearing a walking boot to decrease the use of your gastrocnemius muscle.  Using a wedge under your heel to reduce stretching of your healing muscle.  Wearing a compression sleeve around your calf muscle.  Icing the muscle.  Raising (elevating) your leg when resting.  Taking medicine for pain and swelling (NSAIDs or steroids).  Doing leg exercises as told by your health care provider or physical therapist.  Follow these instructions at home: If you have a splint or compression sleeve:  Wear it as told by your  health care provider. Remove it only as told by your health care provider.  Loosen the splint if your toes tingle, become numb, or turn cold and blue.  If your splint or sleeve is not waterproof: ? Do not let it get wet. ? Protect it with a watertight covering when you take a bath or a shower.  Keep the splint or sleeve clean. Managing pain, stiffness, and swelling  If directed, put ice on the injured area: ? Put ice in a plastic bag. ? Place a towel between your skin and the bag. ? Leave the ice on for 20 minutes, 2-3 times a day.  Move your toes often to avoid stiffness and to lessen swelling.  Raise (elevate) the injured area above the level of your heart while you are sitting or lying down. Driving  Do not drive or operate heavy machinery while taking prescription pain medicine.  Ask your health care provider  when it is safe to drive. Activity  Do not use the injured limb to support your body weight until your health care provider says that you can. Use crutches as told by your health care provider.  Return gradually to your normal activities as told by your health care provider. Ask your health care provider what activities are safe for you.  Do exercises as told by your health care provider.  Return to sporting activity only as told by your health care provider or physical therapist. Full recovery may take several months. General instructions  Take over-the-counter and prescription medicines only as told by your health care provider.  Keep all follow-up visits as told by your health care provider. This is important. How is this prevented?  Warm up and stretch before being active.  Cool down and stretch after being active.  Give your body time to rest between periods of activity.  Make sure to use equipment that fits you.  Be safe and responsible while being active to avoid falls.  Maintain physical fitness, including: ? Strength. ? Flexibility. Contact a  health care provider if:  Your symptoms do not improve with rest and treatment. Get help right away if:  You have swelling or redness in your calf that is getting worse. This information is not intended to replace advice given to you by your health care provider. Make sure you discuss any questions you have with your health care provider. Document Released: 02/22/2005 Document Revised: 10/28/2015 Document Reviewed: 02/04/2015 Elsevier Interactive Patient Education  2018 Elsevier Inc.   Gastrocnemius Tear Rehab Ask your health care provider which exercises are safe for you. Do exercises exactly as told by your health care provider and adjust them as directed. It is normal to feel mild stretching, pulling, tightness, or discomfort as you do these exercises, but you should stop right away if you feel sudden pain or your pain gets worse.Do not begin these exercises until told by your health care provider. Stretching and range of motion exercises These exercises warm up your muscles and joints and improve the movement and flexibility of your lower leg. These exercises also help to relieve pain and stiffness. Exercise A: Gastrocnemius stretch  1. Sit with your left / right leg extended. 2. Loop a belt or towel around the ball of your left / right foot. The ball of your foot is on the walking surface, right under your toes. 3. Hold both ends of the belt or towel. 4. Keep your left / right ankle and foot relaxed and keep your knee straight while you use the belt or towel to pull your foot and ankle toward you. Stop at the first point of resistance. 5. Hold this position for __________ seconds. Repeat __________ times. Complete this exercise __________ times a day. Exercise B: Ankle alphabet  1. Sit with your left / right leg supported at the lower leg. ? Do not rest your foot on anything. ? Make sure your foot has room to move freely. 2. Think of your left / right foot as a paintbrush, and move  your foot to trace each letter of the alphabet in the air. Keep your hip and knee still while you trace. 3. Trace every letter from A to Z. Repeat __________ times. Complete this exercise __________ times a day. Strengthening exercises These exercises build strength and endurance in your lower leg. Endurance is the ability to use your muscles for a long time, even after they get tired. Exercise C: Plantar  flexors with band  1. Sit with your left / right leg extended. 2. Loop a rubber exercise band or tube around the ball of your left / right foot. The ball of your foot is on the walking surface, right under your toes. 3. While holding both ends of the band or tube, slowly point your toes downward, pushing them away from you. 4. Hold this position for __________ seconds. 5. Slowly return your foot to the starting position. Repeat __________ times. Complete this exercise __________ times a day. Exercise D: Plantar flexors, standing  1. Stand with your feet shoulder-width apart. 2. Place your hands on a wall or table to steady yourself as needed, but try not to use it very much for support. 3. Rise up on your toes. 4. If this exercise is too easy, try these options: ? Shift your weight toward your left / right leg until you feel challenged. ? If told by your health care provider, stand on your left / right foot only. 5. Hold this position for __________ seconds. Repeat __________ times. Complete this exercise __________ times a day. Exercise E: Plantar flexors, eccentric  1. Stand on the balls of your feet on the edge of a step. The ball of your foot is on the walking surface, right under your toes. 2. Place your hands on a wall or railing for balance as needed, but try not to lean on it for support. 3. Rise up on your toes, using both legs to help. 4. Slowly shift all of your weight to your left / right foot and lift your other foot off the step. 5. Slowly lower your left / right heel so  it drops below the level of the step. You will feel a slight stretch in your left / right calf. 6. Put your other foot back onto the step. Repeat __________ times. Complete this exercise __________ times a day. This information is not intended to replace advice given to you by your health care provider. Make sure you discuss any questions you have with your health care provider. Document Released: 02/22/2005 Document Revised: 10/30/2015 Document Reviewed: 02/04/2015 Elsevier Interactive Patient Education  [employer].

## 2017-08-27 NOTE — Progress Notes (Signed)
Subjective:    Patient ID: Larry Hayden, male    DOB: March 19, 1985, 32 y.o.   MRN: [id number] Chief Complaint  Patient presents with  . Leg Pain    pt states he has been having left leg pain x 2 days. Pt states the pain has been in the calf area at the muscle. Pt present in the office today with a limp.   . Chronic Gastritis    need refill on medication for pain     HPI Larry BailYeseterday worked from 12 pm to 2 am and he had a sudden grabbing in left lateral upper calf at 8 pm and since then has been limpting around Both feet are swollen  Sometimes still has "liver" pain and he stays on omeprazole which was working for a long time until the past several weeks then it has flaired up much more but has been using an old rx for mylanta/banophen/benzocaine 5-10 ml po q2 hrs prn abd pain and works great.  Past Medical History:  Diagnosis Date  . Acne   . History of hypercalcemia 2015  . Hyperactive gag reflex   . Hyperparathyroidism (HCC)   . Pilonidal sinus 04/2015  . Smoking 06/04/2013  . Tooth injury    right upper front - hx. of repair  . Vitamin D deficiency    Past Surgical History:  Procedure Laterality Date  . NO PAST SURGERIES    . PILONIDAL CYST EXCISION N/A 04/23/2015   Procedure: CYST EXCISION PILONIDAL EXTENSIVE;  Surgeon: Almond LintFaera Byerly, MD;  Location: [facility];  Service: General;  Laterality: N/A;   Current Outpatient Medications on File Prior to Visit  Medication Sig Dispense Refill  . [facility] D3 5000 units capsule TAKE 1 CAPSULE (5,000 UNITS TOTAL) BY MOUTH DAILY. 90 capsule 1   No current facility-administered medications on file prior to visit.    No Known Allergies Family History  Problem Relation Age of Onset  . Cancer Mother   . Heart disease Maternal Grandmother    Social History   Socioeconomic History  . Marital status: Married    Spouse name: Not on file  . Number of children: 0  . Years of education: College  . Highest education level: Not  on file  Occupational History  . Occupation: Clinical biochemistgas station attendant    Employer: [employer]  Social Needs  . Financial resource strain: Not on file  . Food insecurity:    Worry: Not on file    Inability: Not on file  . Transportation needs:    Medical: Not on file    Non-medical: Not on file  Tobacco Use  . Smoking status: Former Smoker    Packs/day: 1.00    Years: 13.00    Pack years: 13.00    Types: Cigarettes    Last attempt to quit: 07/28/2015    Years since quitting: 2.3  . Smokeless tobacco: Never Used  Substance and Sexual Activity  . Alcohol use: No    Alcohol/week: 0.0 standard drinks    Frequency: Never  . Drug use: No  . Sexual activity: Not on file  Lifestyle  . Physical activity:    Days per week: Not on file    Minutes per session: Not on file  . Stress: Not on file  Relationships  . Social connections:    Talks on phone: Not on file    Gets together: Not on file    Attends religious service: Not on file    Active  member of club or organization: Not on file    Attends meetings of clubs or organizations: Not on file    Relationship status: Not on file  Other Topics Concern  . Not on file  Social History Narrative   Originally from [place].   Came to the [place] in 2014.   Has a degree in Public relations account executive, and served in the [employer].   Married summer 2018. His wife lives in [place].   Lives with his sister.   Depression screen [facility] 2/9 11/21/2017 10/05/2017 09/01/2017 08/31/2017 08/27/2017  Decreased Interest 0 0 0 0 0  Down, Depressed, Hopeless 0 0 0 0 0  PHQ - 2 Score 0 0 0 0 0  Altered sleeping - - - - -  Tired, decreased energy - - - - -  Change in appetite - - - - -  Feeling bad or failure about yourself  - - - - -  Trouble concentrating - - - - -  Moving slowly or fidgety/restless - - - - -  Suicidal thoughts - - - - -  PHQ-9 Score - - - - -      Review of Systems See hpi    Objective:   Physical Exam  Constitutional: He is oriented to person,  place, and time. He appears well-developed and well-nourished. No distress.  HENT:  Head: Normocephalic and atraumatic.  Eyes: No scleral icterus.  Pulmonary/Chest: Effort normal.  Abdominal: Normal appearance and bowel sounds are normal. There is no hepatosplenomegaly. There is no tenderness. There is no CVA tenderness, no tenderness at McBurney's point and negative Murphy's sign.  Musculoskeletal:       Left knee: He exhibits decreased range of motion and swelling. He exhibits no effusion, no ecchymosis, no erythema, normal alignment, normal patellar mobility and no bony tenderness. Tenderness found. No medial joint line, no lateral joint line, no MCL, no LCL and no patellar tendon tenderness noted.       Left lower leg: He exhibits tenderness and swelling. He exhibits no bony tenderness.  Most tenderness on posterior lateral popliteal area  Neurological: He is alert and oriented to person, place, and time.  Skin: Skin is warm and dry. He is not diaphoretic.  Psychiatric: He has a normal mood and affect. His behavior is normal.   BP 118/80   Pulse 90   Temp 98 F (36.7 C) (Oral)   Resp 16   Ht 5' 9.09" (1.755 m)   Wt 246 lb (111.6 kg)   SpO2 99%   BMI 36.23 kg/m   CLINICAL DATA:  32 year old with acute pain over the lateral posterior knee joint.  EXAM: LEFT KNEE - 1-2 VIEW  COMPARISON:  None.  FINDINGS: Negative for fracture or dislocation. There appears to be a small to moderate-sized suprapatellar joint effusion. Minimal spurring involving the superior patellofemoral compartment. Alignment of the knee is normal.  IMPRESSION: No acute bone abnormality to left knee.  Probable suprapatellar joint effusion.   Electronically Signed   By: Richarda Overlie M.D.   On: 08/27/2017 10:23    Assessment & Plan:   1. Pain of left calf - suspect strain of calf - rice but if can't stay off and elevated - use boot and compression sleeve on calf. Start stretching. Out of work  until recheck in 4d   2. Posterior left knee pain   3. Gastritis and gastroduodenitis - suspect recurrence of h. Pylori as similar sxs prior went away after h. Pylori trx -  so need to go off ppi x 3 wks so that we can retest  See avs on how to manage gi sxs while off ppi  Orders Placed This Encounter  Procedures  . DG Knee 1-2 Views Left    Standing Status:   Future    Number of Occurrences:   1    Standing Expiration Date:   08/27/2018    Order Specific Question:   Reason for Exam (SYMPTOM  OR DIAGNOSIS REQUIRED)    Answer:   acute pain over lateral posterior joint line and lateral posterior upper calf - sudden onset 8pm last night while walking, suspect partical gastroc tear, suspect + baker's cyst and effusion    Order Specific Question:   Preferred imaging location?    Answer:   External    Meds ordered this encounter  Medications  . HYDROcodone-acetaminophen (NORCO/VICODIN) 5-325 MG tablet    Sig: Take 1 tablet by mouth every 6 (six) hours as needed for moderate pain.    Dispense:  20 tablet    Refill:  0    [name], MD, MPH Primary Care at [facility] [address] [place], [place]  [address] [phone] Office phone  [phone] Office fax   12/10/17 10:35 PM

## 2017-08-30 ENCOUNTER — Other Ambulatory Visit: Payer: Self-pay | Admitting: Physician Assistant

## 2017-08-30 DIAGNOSIS — J029 Acute pharyngitis, unspecified: Secondary | ICD-10-CM

## 2017-08-31 ENCOUNTER — Encounter: Payer: Self-pay | Admitting: Family Medicine

## 2017-08-31 ENCOUNTER — Other Ambulatory Visit: Payer: Self-pay

## 2017-08-31 ENCOUNTER — Ambulatory Visit: Payer: BLUE CROSS/BLUE SHIELD | Admitting: Family Medicine

## 2017-08-31 VITALS — BP 126/76 | HR 82 | Temp 99.9°F | Resp 16 | Ht 69.09 in

## 2017-08-31 DIAGNOSIS — M79662 Pain in left lower leg: Secondary | ICD-10-CM

## 2017-08-31 DIAGNOSIS — S86812D Strain of other muscle(s) and tendon(s) at lower leg level, left leg, subsequent encounter: Secondary | ICD-10-CM | POA: Diagnosis not present

## 2017-08-31 DIAGNOSIS — M25562 Pain in left knee: Secondary | ICD-10-CM

## 2017-08-31 NOTE — Progress Notes (Addendum)
Subjective:  By signing my name below, I, Larry Hayden, attest that this documentation has been prepared under the direction and in the presence of [name], MD Electronically Signed: Charline Bills, ED Scribe 08/31/2017 at 10:59 AM.   Patient ID: Larry Hayden, male    DOB: 1985-07-27, 32 y.o.   MRN: [id number]  Chief Complaint  Patient presents with  . Leg Pain    follow up for left calf pain    HPI Larry Hayden is a 32 y.o. male who presents to Primary Care at [facility] for f/u on L calf pain. Pt states that he still has some L leg pain but pain has improved overall. He has been wearing the boot consistently, only removes it to sleep. 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 States he typically stands/walks for about 64 hrs/wk at work.  Past Medical History:  Diagnosis Date  . Acne   . History of hypercalcemia 2015  . Hyperactive gag reflex   . Hyperparathyroidism (HCC)   . Pilonidal sinus 04/2015  . Smoking 06/04/2013  . Tooth injury    right upper front - hx. of repair  . Vitamin D deficiency    Past Surgical History:  Procedure Laterality Date  . NO PAST SURGERIES    . PILONIDAL CYST EXCISION N/A 04/23/2015   Procedure: CYST EXCISION PILONIDAL EXTENSIVE;  Surgeon: Almond Lint, MD;  Location: [facility];  Service: General;  Laterality: N/A;   Current Outpatient Medications on File Prior to Visit  Medication Sig Dispense Refill  . azelastine (ASTELIN) 0.1 % nasal spray Place 2 sprays into both nostrils 2 (two) times daily. Use in each nostril as directed 30 mL 0  . [facility] D3 5000 units capsule TAKE 1 CAPSULE (5,000 UNITS TOTAL) BY MOUTH DAILY. 90 capsule 1  . HYDROcodone-acetaminophen (NORCO/VICODIN) 5-325 MG tablet Take 1 tablet by mouth every 6 (six) hours as needed for moderate pain. 20 tablet 0   No current facility-administered medications on file prior to visit.    No Known  Allergies Family History  Problem Relation Age of Onset  . Cancer Mother   . Heart disease Maternal Grandmother    Social History   Socioeconomic History  . Marital status: Married    Spouse name: Not on file  . Number of children: 0  . Years of education: College  . Highest education level: Not on file  Occupational History  . Occupation: Clinical biochemist: [employer]  Social Needs  . Financial resource strain: Not on file  . Food insecurity:    Worry: Not on file    Inability: Not on file  . Transportation needs:    Medical: Not on file    Non-medical: Not on file  Tobacco Use  . Smoking status: Former Smoker    Packs/day: 1.00    Years: 13.00    Pack years: 13.00    Types: Cigarettes    Last attempt to quit: 07/28/2015    Years since quitting: 2.0  . Smokeless tobacco: Never Used  Substance and Sexual Activity  . Alcohol use: No    Alcohol/week: 0.0 oz    Frequency: Never  . Drug use: No  . Sexual activity: Not on file  Lifestyle  . Physical activity:    Days per week: Not on file    Minutes per session: Not on file  . Stress: Not on file  Relationships  . Social connections:    Talks on phone: Not on file    Gets together: Not on file    Attends religious service: Not on file    Active member of club or organization: Not on file    Attends meetings of clubs or organizations: Not on file    Relationship status: Not on file  Other Topics Concern  . Not on file  Social History Narrative   Originally from [place].   [place]ame [place] the [place] in [place].   Has a degree in Public relations account executiveengineering, and served in the [employer].   Married summer 2018. His wife lives in [place].   Lives with his sister.   [facility] 08/31/2017 08/27/2017 12/28/2016 12/13/2016 11/24/2016  Decreased Interest 0 0 0 0 0  Down, Depressed, Hopeless 0 0 0 0 0  PHQ - 2 Score 0 0 0 0 0  Altered sleeping - - - - -  Tired, decreased energy - - - - -  Change in appetite - - - - -   Feeling bad or failure about yourself  - - - - -  Trouble concentrating - - - - -  Moving slowly or fidgety/restless - - - - -  Suicidal thoughts - - - - -  PHQ-9 Score - - - - -   Review of Systems  Musculoskeletal: Positive for myalgias.      Objective:   Physical Exam  Constitutional: He is oriented to person, place, and time. He appears well-developed and well-nourished. No distress.  HENT:  Head: Normocephalic and atraumatic.  Eyes: Conjunctivae and EOM are normal.  Neck: Neck supple. No tracheal deviation present.  Cardiovascular: Normal rate.  Pulses:      Dorsalis pedis pulses are 2+ on the right side, and 2+ on the left side.       Posterior tibial pulses are 2+ on the right side, and 2+ on the left side.  Pulmonary/Chest: Effort normal. No respiratory distress.  Musculoskeletal: Normal range of motion.  L leg: Achilles and lower calf muscle belly intact. Still mild effusion over baker's cyst popliteal fossa but decreased from prior. No tenderness of tib/fib squeeze or ankle. Some trace pitting edema of L LE. No joint line tenderness of medial/lateral aspects. No pain or instability with varus/valgus stress. Neg anterior/posterior drawer. Plantar flexion and dorsiflexion 5/5.   Neurological: He is alert and oriented to person, place, and time.  Skin: Skin is warm and dry.  Psychiatric: He has a normal mood and affect. His behavior is normal.  Nursing note and vitals reviewed.  BP 126/76   Pulse 82   Temp 99.9 F (37.7 C)   Resp 16   Ht 5' 9.09" (1.755 m)   SpO2 98%   BMI 36.23 kg/m     Assessment & Plan:   1. Strain of calf muscle, left, subsequent encounter   2. Pain of left calf   3. Posterior left knee pain    Apply heat to your injury before work.  After work, ice for 20 minutes and again before bed. Wear your boot whenever you are not in work. If you are still having pain in 4-6 weeks or if at any time your pain gets worse, please come back into the  office for repeat evaluation asap.  In general to prevent further injury, after work you should take a hot shower or bath and do a few minutes of stretching of your hamstrings and quads.  I personally performed the services described in this documentation, which was scribed in my presence. The recorded information has been reviewed and considered, and addended by me as needed.   [name], M.D.  Primary Care at [facility] [address] [place], [place] [address] [phone] phone [phone] fax  12/10/17 10:59 PM

## 2017-08-31 NOTE — Patient Instructions (Addendum)
Apply heat to your injury before work.  After work, ice for 20 minutes and again before bed. Wear your boot whenever you are not in work. If you are still having pain in 4-6 weeks or if at any time your pain gets worse, please come back into the office for repeat evaluation asap.  In general to prevent further injury, after work you should take a hot shower or bath and do a few minutes of stretching of your hamstrings and quads.   IF you received an x-ray today, you will receive an invoice from [facility]. Please contact [facility] at [phone] with questions or concerns regarding your invoice.   IF you received labwork today, you will receive an invoice from [facility]. Please contact [facility] at [phone] with questions or concerns regarding your invoice.   Our billing staff will not be able to assist you with questions regarding bills from these companies.  You will be contacted with the lab results as soon as they are available. The fastest way to get your results is to activate your My Chart account. Instructions are located on the last page of this paperwork. If you have not heard from us regarding the results in 2 weeks, please contact this office.      Upper Calf Strain/Tear Rehab Ask your health care provider which exercises are safe for you. Do exercises exactly as told by your health care provider and adjust them as directed. It is normal to feel mild stretching, pulling, tightness, or discomfort as you do these exercises, but you should stop right away if you feel sudden pain or your pain gets worse.Do not begin these exercises until told by your health care provider. Stretching and range of motion exercises These exercises warm up your muscles and joints and improve the movement and flexibility of your lower leg. These exercises also help to relieve pain and stiffness. Exercise A: Gastrocnemius stretch  1. Sit with your left / right leg  extended. 2. Loop a belt or towel around the ball of your left / right foot. The ball of your foot is on the walking surface, right under your toes. 3. Hold both ends of the belt or towel. 4. Keep your left / right ankle and foot relaxed and keep your knee straight while you use the belt or towel to pull your foot and ankle toward you. Stop at the first point of resistance. 5. Hold this position for __________ seconds. Repeat __________ times. Complete this exercise __________ times a day. Exercise B: Ankle alphabet  1. Sit with your left / right leg supported at the lower leg. ? Do not rest your foot on anything. ? Make sure your foot has room to move freely. 2. Think of your left / right foot as a paintbrush, and move your foot to trace each letter of the alphabet in the air. Keep your hip and knee still while you trace. 3. Trace every letter from A to Z. Repeat __________ times. Complete this exercise __________ times a day. Strengthening exercises These exercises build strength and endurance in your lower leg. Endurance is the ability to use your muscles for a long time, even after they get tired. Exercise C: Plantar flexors with band  1. Sit with your left / right leg extended. 2. Loop a rubber exercise band or tube around the ball of your left / right foot. The ball of your foot is on the walking surface, right under your toes. 3. While holding both  ends of the band or tube, slowly point your toes downward, pushing them away from you. 4. Hold this position for __________ seconds. 5. Slowly return your foot to the starting position. Repeat __________ times. Complete this exercise __________ times a day. Exercise D: Plantar flexors, standing  1. Stand with your feet shoulder-width apart. 2. Place your hands on a wall or table to steady yourself as needed, but try not to use it very much for support. 3. Rise up on your toes. 4. If this exercise is too easy, try these options: ? Shift  your weight toward your left / right leg until you feel challenged. ? If told by your health care provider, stand on your left / right foot only. 5. Hold this position for __________ seconds. Repeat __________ times. Complete this exercise __________ times a day. Exercise E: Plantar flexors, eccentric  1. Stand on the balls of your feet on the edge of a step. The ball of your foot is on the walking surface, right under your toes. 2. Place your hands on a wall or railing for balance as needed, but try not to lean on it for support. 3. Rise up on your toes, using both legs to help. 4. Slowly shift all of your weight to your left / right foot and lift your other foot off the step. 5. Slowly lower your left / right heel so it drops below the level of the step. You will feel a slight stretch in your left / right calf. 6. Put your other foot back onto the step. Repeat __________ times. Complete this exercise __________ times a day. This information is not intended to replace advice given to you by your health care provider. Make sure you discuss any questions you have with your health care provider. Document Released: 02/22/2005 Document Revised: 10/30/2015 Document Reviewed: 02/04/2015 Elsevier Interactive Patient Education  [employer].

## 2017-09-01 ENCOUNTER — Encounter: Payer: Self-pay | Admitting: Family Medicine

## 2017-09-01 ENCOUNTER — Ambulatory Visit: Payer: BLUE CROSS/BLUE SHIELD | Admitting: Family Medicine

## 2017-09-01 ENCOUNTER — Other Ambulatory Visit: Payer: Self-pay

## 2017-09-01 VITALS — BP 126/82 | HR 93 | Temp 99.1°F | Resp 16 | Ht 69.0 in | Wt 248.2 lb

## 2017-09-01 DIAGNOSIS — J029 Acute pharyngitis, unspecified: Secondary | ICD-10-CM | POA: Diagnosis not present

## 2017-09-01 LAB — POCT RAPID STREP A (OFFICE): RAPID STREP A SCREEN: NEGATIVE

## 2017-09-01 MED ORDER — AMOXICILLIN 875 MG PO TABS: 875.0000 mg | ORAL_TABLET | Freq: Two times a day (BID) | ORAL | 0 refills | Status: DC

## 2017-09-01 NOTE — Progress Notes (Signed)
Patient ID: Larry Hayden, male    DOB: [birth year]  Age: 32 y.o. MRN: [id number]  Chief Complaint  Patient presents with  . Sore Throat    "started on yesterday, hurts to eat"    Subjective:   Patient is here complaining of a sore throat.  He works in [employer].  He does not know of being close with anyone who has a major sore throat, but his sister had one.  He has had a sore throat for 2 days, starting yesterday.  He has had a little fever.  He has body aches.  He smokes intermittently.  Does not know of having strep ever, does not know what strep is.  He has had frequent sore throats in the past.  The patient is from [place], has been in the [place] about 4 [place].  Current allergies, medications, problem list, past/family and social histories reviewed.  Objective:  BP 126/82 (BP Location: Left Arm, Patient Position: Sitting, Cuff Size: Large)   Pulse 93   Temp 99.1 F (37.3 C) (Oral)   Resp 16   Ht 5\' 9"  (1.753 m)   Wt 248 lb 3.2 oz (112.6 kg)   SpO2 97%   BMI 36.65 kg/m   No major acute distress.  Low-grade temp as noted.  TMs normal.  Throat mildly erythematous has exudate especially on the right tonsil.  Neck supple with small nodes.  Chest is clear to auscultation.  Heart regular without murmurs.  Assessment & Plan:   Assessment: 1. Sore throat       Plan: Strep test is negative, but will wait for the culture.  I will give him antibiotics to take in the meanwhile because he has enough clinical reasons.  Orders Placed This Encounter  Procedures  . Culture, Group A Strep    Order Specific Question:   Source    Answer:   throat  . POCT rapid strep A    Meds ordered this encounter  Medications  . amoxicillin (AMOXIL) 875 MG tablet    Sig: Take 1 tablet (875 mg total) by mouth 2 (two) times daily.    Dispense:  20 tablet    Refill:  0         Patient Instructions   You have a little elevation of your temperature, body aches, and some pus on the right  tonsil making things consistent with streptococcal pharyngitis, but the rapid test for strep is negative.  We have sent off a culture to be certain, and will let you know the results of that in a couple of days.  Do not smoke  Drink plenty of water  Take Tylenol (acetaminophen) 500 mg 2 pills 3 times daily or ibuprofen 200 mg 3 pills 3 times daily as needed for throat pain or fever or body aches.  Begin taking amoxicillin 875 mg twice daily.  If the culture test comes back negative we will contact you and you should disc and continue the medication because it is unnecessary.  A virus sore throat can be very similar to a strep sore throat, but the virus will run its course without any medications.  Return if worse  Pharyngitis Pharyngitis is a sore throat (pharynx). There is redness, pain, and swelling of your throat. Follow these instructions at home:  Drink enough fluids to keep your pee (urine) clear or pale yellow.  Only take medicine as told by your doctor. ? You may get sick again if you do not take medicine  as told. Finish your medicines, even if you start to feel better. ? Do not take aspirin.  Rest.  Rinse your mouth (gargle) with salt water ( tsp of salt per 1 qt of water) every 1-2 hours. This will help the pain.  If you are not at risk for choking, you can suck on hard candy or sore throat lozenges. Contact a doctor if:  You have large, tender lumps on your neck.  You have a rash.  You cough up green, yellow-brown, or bloody spit. Get help right away if:  You have a stiff neck.  You drool or cannot swallow liquids.  You throw up (vomit) or are not able to keep medicine or liquids down.  You have very bad pain that does not go away with medicine.  You have problems breathing (not from a stuffy nose). This information is not intended to replace advice given to you by your health care provider. Make sure you discuss any questions you have with your health care  provider. Document Released: 08/11/2007 Document Revised: 07/31/2015 Document Reviewed: 10/30/2012 Elsevier Interactive Patient Education  2017 [employer].    IF you received an x-ray today, you will receive an invoice from [facility]. Please contact [facility] at [phone] with questions or concerns regarding your invoice.   IF you received labwork today, you will receive an invoice from [place]. Please contact [facility] at [phone] with questions or concerns regarding your invoice.   Our billing staff will not be able to assist you with questions regarding bills from these companies.  You will be contacted with the lab results as soon as they are available. The fastest way to get your results is to activate your My Chart account. Instructions are located on the last page of this paperwork. If you have not heard from [place] regarding the results in 2 weeks, please contact this office.        Return if symptoms worsen or fail to improve.   Janace Hoard, MD 09/01/2017

## 2017-09-01 NOTE — Patient Instructions (Addendum)
You have a little elevation of your temperature, body aches, and some pus on the right tonsil making things consistent with streptococcal pharyngitis, but the rapid test for strep is negative.  We have sent off a culture to be certain, and will let you know the results of that in a couple of days.  Do not smoke  Drink plenty of water  Take Tylenol (acetaminophen) 500 mg 2 pills 3 times daily or ibuprofen 200 mg 3 pills 3 times daily as needed for throat pain or fever or body aches.  Begin taking amoxicillin 875 mg twice daily.  If the culture test comes back negative we will contact you and you should disc and continue the medication because it is unnecessary.  A virus sore throat can be very similar to a strep sore throat, but the virus will run its course without any medications.  Return if worse  Pharyngitis Pharyngitis is a sore throat (pharynx). There is redness, pain, and swelling of your throat. Follow these instructions at home:  Drink enough fluids to keep your pee (urine) clear or pale yellow.  Only take medicine as told by your doctor. ? You may get sick again if you do not take medicine as told. Finish your medicines, even if you start to feel better. ? Do not take aspirin.  Rest.  Rinse your mouth (gargle) with salt water ( tsp of salt per 1 qt of water) every 1-2 hours. This will help the pain.  If you are not at risk for choking, you can suck on hard candy or sore throat lozenges. Contact a doctor if:  You have large, tender lumps on your neck.  You have a rash.  You cough up green, yellow-brown, or bloody spit. Get help right away if:  You have a stiff neck.  You drool or cannot swallow liquids.  You throw up (vomit) or are not able to keep medicine or liquids down.  You have very bad pain that does not go away with medicine.  You have problems breathing (not from a stuffy nose). This information is not intended to replace advice given to you by your  health care provider. Make sure you discuss any questions you have with your health care provider. Document Released: 08/11/2007 Document Revised: 07/31/2015 Document Reviewed: 10/30/2012 Elsevier Interactive Patient Education  2017 [employer]    IF you received an x-ray today, you will receive an invoice from [facility]. Please contact [facility] at [phone] with questions or concerns regarding your invoice.   IF you received labwork today, you will receive an invoice from [facility]. Please contact [facility] at [phone] with questions or concerns regarding your invoice.   Our billing staff will not be able to assist you with questions regarding bills from these companies.  You will be contacted with the lab results as soon as they are available. The fastest way to get your results is to activate your My Chart account. Instructions are located on the last page of this paperwork. If you have not heard from us regarding the results in 2 weeks, please contact this office.

## 2017-09-03 ENCOUNTER — Telehealth: Payer: Self-pay

## 2017-09-03 ENCOUNTER — Other Ambulatory Visit: Payer: Self-pay | Admitting: Physician Assistant

## 2017-09-03 DIAGNOSIS — J029 Acute pharyngitis, unspecified: Secondary | ICD-10-CM

## 2017-09-03 LAB — CULTURE, GROUP A STREP: STREP A CULTURE: POSITIVE — AB

## 2017-09-05 NOTE — Telephone Encounter (Signed)
Astelin 0.1 % nasal spray & Claritin 10 mg refill requests  LOV 08/03/17 with [name] Jeffery.  [id number] [address] [place] [place]  PCP:  Dr. Clelia CroftShaw

## 2017-09-21 NOTE — Telephone Encounter (Signed)
done in error 

## 2017-10-05 ENCOUNTER — Other Ambulatory Visit: Payer: Self-pay

## 2017-10-05 ENCOUNTER — Ambulatory Visit (INDEPENDENT_AMBULATORY_CARE_PROVIDER_SITE_OTHER): Payer: BLUE CROSS/BLUE SHIELD | Admitting: Family Medicine

## 2017-10-05 ENCOUNTER — Encounter: Payer: Self-pay | Admitting: Family Medicine

## 2017-10-05 VITALS — BP 134/90 | HR 82 | Temp 98.0°F | Resp 16 | Ht 69.49 in | Wt 254.0 lb

## 2017-10-05 DIAGNOSIS — R7989 Other specified abnormal findings of blood chemistry: Secondary | ICD-10-CM

## 2017-10-05 DIAGNOSIS — R945 Abnormal results of liver function studies: Secondary | ICD-10-CM | POA: Diagnosis not present

## 2017-10-05 DIAGNOSIS — K2990 Gastroduodenitis, unspecified, without bleeding: Secondary | ICD-10-CM

## 2017-10-05 DIAGNOSIS — K2970 Gastritis, unspecified, without bleeding: Secondary | ICD-10-CM

## 2017-10-05 LAB — LIPID PANEL
Chol/HDL Ratio: 5.3 ratio — ABNORMAL HIGH (ref 0.0–5.0)
Cholesterol, Total: 223 mg/dL — ABNORMAL HIGH (ref 100–199)
HDL: 42 mg/dL (ref >39)
LDL CALC: 151 mg/dL — AB (ref 0–99)
Triglycerides: 151 mg/dL — ABNORMAL HIGH (ref 0–149)
VLDL Cholesterol Cal: 30 mg/dL (ref 5–40)

## 2017-10-05 NOTE — Patient Instructions (Addendum)
IF you received an x-ray today, you will receive an invoice from [facility]. Please contact [facility] at [phone] with questions or concerns regarding your invoice.   IF you received labwork today, you will receive an invoice from [facility]. Please contact [facility] at [phone] with questions or concerns regarding your invoice.   Our billing staff will not be able to assist you with questions regarding bills from these companies.  You will be contacted with the lab results as soon as they are available. The fastest way to get your results is to activate your My Chart account. Instructions are located on the last page of this paperwork. If you have not heard from us regarding the results in 2 weeks, please contact this office.    Gastritis, Adult Gastritis is inflammation of the stomach. There are two kinds of gastritis:  Acute gastritis. This kind develops suddenly.  Chronic gastritis. This kind lasts for a long time.  Gastritis happens when the lining of the stomach becomes weak or gets damaged. Without treatment, gastritis can lead to stomach bleeding and ulcers. What are the causes? This condition may be caused by:  An infection.  Drinking too much alcohol.  Certain medicines.  Having too much acid in the stomach.  A disease of the intestines or stomach.  Stress.  What are the signs or symptoms? Symptoms of this condition include:  Pain or a burning in the upper abdomen.  Nausea.  Vomiting.  An uncomfortable feeling of fullness after eating.  In some cases, there are no symptoms. How is this diagnosed? This condition may be diagnosed with:  A description of your symptoms.  A physical exam.  Tests. These can include: ? Blood tests. ? Stool tests. ? A test in which a thin, flexible instrument with a light and camera on the end is passed down the esophagus and into the stomach (upper endoscopy). ? A test in which a sample of  tissue is taken for testing (biopsy).  How is this treated? This condition may be treated with medicines. If the condition is caused by a bacterial infection, you may be given antibiotic medicines. If it is caused by too much acid in the stomach, you may get medicines called H2 blockers, proton pump inhibitors, or antacids. Treatment may also involve stopping the use of certain medicines, such as aspirin, ibuprofen, or other nonsteroidal anti-inflammatory drugs (NSAIDs). Follow these instructions at home:  Take over-the-counter and prescription medicines only as told by your health care provider.  If you were prescribed an antibiotic, take it as told by your health care provider. Do not stop taking the antibiotic even if you start to feel better.  Drink enough fluid to keep your urine clear or pale yellow.  Eat small, frequent meals instead of large meals. Contact a health care provider if:  Your symptoms get worse.  Your symptoms return after treatment. Get help right away if:  You vomit blood or material that looks like coffee grounds.  You have black or dark red stools.  You are unable to keep fluids down.  Your abdominal pain gets worse.  You have a fever.  You do not feel better after 1 week. This information is not intended to replace advice given to you by your health care provider. Make sure you discuss any questions you have with your health care provider. Document Released: 02/16/2001 Document Revised: 10/22/2015 Document Reviewed: 11/16/2014 Elsevier Interactive Patient Education  2018 [employer] Food Choices for  Peptic Ulcer Disease When you have peptic ulcer disease, the foods you eat and your eating habits are very important. Choosing the right foods can help ease the discomfort of peptic ulcer disease. What general guidelines do I need to follow?  Choose fruits, vegetables, whole grains, and low-fat meat, fish, and poultry.  Keep a food diary to identify  foods that cause symptoms.  Avoid foods that cause irritation or pain. These may be different for different people.  Eat frequent small meals instead of three large meals each day. The pain may be worse when your stomach is empty.  Avoid eating close to bedtime. What foods are not recommended? The following are some foods and drinks that may worsen your symptoms:  Black, white, and red pepper.  Hot sauce.  Chili peppers.  Chili powder.  Chocolate and cocoa.  Alcohol.  Tea, coffee, and cola (regular and decaffeinated).  The items listed above may not be a complete list of foods and beverages to avoid. Contact your dietitian for more information. This information is not intended to replace advice given to you by your health care provider. Make sure you discuss any questions you have with your health care provider. Document Released: 05/17/2011 Document Revised: 07/31/2015 Document Reviewed: 12/27/2012 Elsevier Interactive Patient Education  2017 [employer].

## 2017-10-05 NOTE — Progress Notes (Signed)
I,Arielle J Pollard,acting as a scribe for Sherren Mocha, MD.,have documented all relevant documentation on the behalf of Sherren Mocha, MD,as directed by  Sherren Mocha, MD while in the presence of Sherren Mocha, MD. 10/05/2017 10:50 AM  Subjective:    Patient ID: Larry Hayden, male    DOB: February 05, 1986, 32 y.o.   MRN: [id number]  Chief Complaint  Patient presents with  . Recheck Labs    pt states he needs to follow-up about liver test, 3 month follow-up   HPI Larry Hayden is a 32 y.o. male who presents to Primary Care at [facility] to recheck labs. He was seen previously for abdominal pain, which subsided after taking opmeprozole. He has used an old prescription of mylanta. He took magnesium and vitamin D 1- 5000 IU tablet. He notes that his pain subsided, but returned 4 days ago up until today.   He continues to feel pain in his left leg. He wears a calf compression sleeve and boot when not working. He ices his leg at night. This has made it feel better. He completed school and is now working 50 hours per week.    Patient Active Problem List   Diagnosis Date Noted  . Gastritis and gastroduodenitis 04/04/2017  . Episodic mood disorder (HCC) 01/15/2017  . BMI 34.0-34.9,adult 11/19/2016  . Prediabetes 09/17/2016  . Hyperparathyroidism (HCC) 12/17/2013  . Vitamin D deficiency 12/17/2013  . Hypercalcemia 06/04/2013  . Stiffness of hand joint 06/04/2013  . Smoker 05/11/2013  . Hyperlipidemia 03/30/2013  . Elevated BP without diagnosis of hypertension 02/16/2013  . Acne 02/16/2013   Past Medical History:  Diagnosis Date  . Acne   . History of hypercalcemia 2015  . Hyperactive gag reflex   . Hyperparathyroidism (HCC)   . Pilonidal sinus 04/2015  . Smoking 06/04/2013  . Tooth injury    right upper front - hx. of repair  . Vitamin D deficiency    Past Surgical History:  Procedure Laterality Date  . NO PAST SURGERIES    . PILONIDAL CYST EXCISION N/A 04/23/2015   Procedure: CYST EXCISION  PILONIDAL EXTENSIVE;  Surgeon: Almond Lint, MD;  Location: [facility];  Service: General;  Laterality: N/A;   No Known Allergies Prior to Admission medications   Medication Sig Start Date End Date Taking? Authorizing Provider  amoxicillin (AMOXIL) 875 MG tablet Take 1 tablet (875 mg total) by mouth 2 (two) times daily. 09/01/17   Peyton Najjar, MD  azelastine (ASTELIN) 0.1 % nasal spray Place 2 sprays into both nostrils 2 (two) times daily. Use in each nostril as directed 08/03/17   Porfirio Oar, PA-C  [facility] D3 5000 units capsule TAKE 1 CAPSULE (5,000 UNITS TOTAL) BY MOUTH DAILY. 05/16/17   Sherren Mocha, MD  HYDROcodone-acetaminophen (NORCO/VICODIN) 5-325 MG tablet Take 1 tablet by mouth every 6 (six) hours as needed for moderate pain. 08/27/17   Sherren Mocha, MD   Social History   Socioeconomic History  . Marital status: Married    Spouse name: Not on file  . Number of children: 0  . Years of education: College  . Highest education level: Not on file  Occupational History  . Occupation: Clinical biochemist: [employer]  Social Needs  . Financial resource strain: Not on file  . Food insecurity:    Worry: Not on file    Inability: Not on file  . Transportation needs:    Medical: Not on file  Non-medical: Not on file  Tobacco Use  . Smoking status: Former Smoker    Packs/day: 1.00    Years: 13.00    Pack years: 13.00    Types: Cigarettes    Last attempt to quit: 07/28/2015    Years since quitting: 2.1  . Smokeless tobacco: Never Used  Substance and Sexual Activity  . Alcohol use: No    Alcohol/week: 0.0 oz    Frequency: Never  . Drug use: No  . Sexual activity: Not on file  Lifestyle  . Physical activity:    Days per week: Not on file    Minutes per session: Not on file  . Stress: Not on file  Relationships  . Social connections:    Talks on phone: Not on file    Gets together: Not on file    Attends religious service: Not on file     Active member of club or organization: Not on file    Attends meetings of clubs or organizations: Not on file    Relationship status: Not on file  . Intimate partner violence:    Fear of current or ex partner: Not on file    Emotionally abused: Not on file    Physically abused: Not on file    Forced sexual activity: Not on file  Other Topics Concern  . Not on file  Social History Narrative   Originally from [place].   Came to the [place] in [place].   Has a degree in Public relations account executiveengineering, and served in the [employer].   Married summer 2018. His wife lives in [place].   Lives with his sister.    Review of Systems  Constitutional: Negative for chills, fatigue and fever.  HENT: Negative for congestion, sneezing and sore throat.   Respiratory: Negative for cough and chest tightness.   Cardiovascular: Negative for chest pain and palpitations.  Gastrointestinal: Positive for abdominal pain. Negative for constipation, diarrhea and vomiting.  Musculoskeletal: Negative.   Neurological: Negative for dizziness, light-headedness and headaches.  Psychiatric/Behavioral: Negative for agitation and dysphoric mood. The patient is not nervous/anxious.   All other systems reviewed and are negative.      Objective:   Physical Exam  Constitutional: He is oriented to person, place, and time. He appears well-developed and well-nourished.  HENT:  Head: Normocephalic.  Mouth/Throat: Oropharynx is clear and moist.  Eyes: Pupils are equal, round, and reactive to light. EOM are normal.  Neck: Normal range of motion. No thyromegaly present.  Cardiovascular: Normal rate, regular rhythm and normal heart sounds.  No murmur heard. Pulmonary/Chest: Effort normal and breath sounds normal.  Abdominal: Soft. Bowel sounds are normal. There is tenderness in the left lower quadrant.  Neurological: He is alert and oriented to person, place, and time.  Psychiatric: He has a normal mood and affect. His behavior is normal.       Vitals:   10/05/17 1049  BP: 134/90  Pulse: 82  Resp: 16  Temp: 98 F (36.7 C)  SpO2: 98%       Assessment & Plan:   1. Elevated LFTs   2. Hypercalcemia - recheck - if persists w/ D level replaced, refer back to Dr. Elvera LennoxGherghe to finish prior w/u - suspect familials hypocalciuric hypercalcemia  3. Low vitamin D level   4. Hypophosphatemia   5. Gastritis and gastroduodenitis - has been off of ppi x 4-5 wks so recheck of h. Pylori today as similar sxs resolved prior after dx and tx  Orders Placed This Encounter  Procedures  . Hepatic Function Panel  . Renal Function Panel  . PTH, Intact and Calcium  . VITAMIN D 25 Hydroxy (Vit-D Deficiency, Fractures)  . Phosphorus  . Lipid panel    Order Specific Question:   Has the patient fasted?    Answer:   Yes  . H. pylori breath test  I personally performed the services described in this documentation, which was scribed in my presence. The recorded information has been reviewed and considered, and addended by me as needed.   [name], M.D.  Primary Care at [facility] [address] [place], [place] [address] [phone] phone [phone] fax  12/10/17 10:58 PM

## 2017-10-06 LAB — RENAL FUNCTION PANEL
Albumin: 4.7 g/dL (ref 3.5–5.5)
BUN/Creatinine Ratio: 17 (ref 9–20)
BUN: 13 mg/dL (ref 6–20)
CO2: 20 mmol/L (ref 20–29)
CREATININE: 0.75 mg/dL — AB (ref 0.76–1.27)
Calcium: 10.8 mg/dL — ABNORMAL HIGH (ref 8.7–10.2)
Chloride: 101 mmol/L (ref 96–106)
GFR calc Af Amer: 139 mL/min/{1.73_m2} (ref >59)
GFR, EST NON AFRICAN AMERICAN: 121 mL/min/{1.73_m2} (ref >59)
Glucose: 102 mg/dL — ABNORMAL HIGH (ref 65–99)
PHOSPHORUS: 2.6 mg/dL (ref 2.5–4.5)
Potassium: 4.5 mmol/L (ref 3.5–5.2)
Sodium: 140 mmol/L (ref 134–144)

## 2017-10-06 LAB — HEPATIC FUNCTION PANEL
ALK PHOS: 77 IU/L (ref 39–117)
ALT: 62 IU/L — ABNORMAL HIGH (ref 0–44)
AST: 26 IU/L (ref 0–40)
BILIRUBIN TOTAL: 0.4 mg/dL (ref 0.0–1.2)
BILIRUBIN, DIRECT: 0.11 mg/dL (ref 0.00–0.40)
TOTAL PROTEIN: 7.3 g/dL (ref 6.0–8.5)

## 2017-10-06 LAB — PHOSPHORUS: PHOSPHORUS: 2.6 mg/dL (ref 2.5–4.5)

## 2017-10-06 LAB — PTH, INTACT AND CALCIUM
Calcium: 10.7 mg/dL — ABNORMAL HIGH (ref 8.7–10.2)
PTH: 60 pg/mL (ref 15–65)

## 2017-10-06 LAB — VITAMIN D 25 HYDROXY (VIT D DEFICIENCY, FRACTURES): Vit D, 25-Hydroxy: 26.8 ng/mL — ABNORMAL LOW (ref 30.0–100.0)

## 2017-10-06 LAB — H. PYLORI BREATH TEST: H PYLORI BREATH TEST: NEGATIVE

## 2017-11-18 ENCOUNTER — Telehealth: Payer: Self-pay | Admitting: Family Medicine

## 2017-11-18 NOTE — Telephone Encounter (Signed)
Called pt. To reschedule visit on 01-05-18 with Dr. Clelia CroftShaw. Rescheduled with pt.

## 2017-11-21 ENCOUNTER — Encounter: Payer: Self-pay | Admitting: Emergency Medicine

## 2017-11-21 ENCOUNTER — Other Ambulatory Visit: Payer: Self-pay

## 2017-11-21 ENCOUNTER — Ambulatory Visit (INDEPENDENT_AMBULATORY_CARE_PROVIDER_SITE_OTHER): Payer: BLUE CROSS/BLUE SHIELD | Admitting: Emergency Medicine

## 2017-11-21 ENCOUNTER — Ambulatory Visit (INDEPENDENT_AMBULATORY_CARE_PROVIDER_SITE_OTHER): Payer: BLUE CROSS/BLUE SHIELD

## 2017-11-21 VITALS — BP 131/84 | HR 78 | Temp 99.0°F | Resp 16 | Wt 252.2 lb

## 2017-11-21 DIAGNOSIS — M25561 Pain in right knee: Secondary | ICD-10-CM

## 2017-11-21 DIAGNOSIS — S83411A Sprain of medial collateral ligament of right knee, initial encounter: Secondary | ICD-10-CM | POA: Diagnosis not present

## 2017-11-21 MED ORDER — PREDNISONE 20 MG PO TABS: 40.0000 mg | ORAL_TABLET | Freq: Every day | ORAL | 0 refills | Status: AC

## 2017-11-21 MED ORDER — TRAMADOL HCL 50 MG PO TABS: 50.0000 mg | ORAL_TABLET | Freq: Three times a day (TID) | ORAL | 0 refills | Status: AC | PRN

## 2017-11-21 NOTE — Patient Instructions (Addendum)
     If you have lab work done today you will be contacted with your lab results within the next 2 weeks.  If you have not heard from us then please contact us. The fastest way to get your results is to register for My Chart.   IF you received an x-ray today, you will receive an invoice from [facility]. Please contact [facility] at [phone] with questions or concerns regarding your invoice.   IF you received labwork today, you will receive an invoice from [facility]. Please contact [facility] at [phone] with questions or concerns regarding your invoice.   Our billing staff will not be able to assist you with questions regarding bills from these companies.  You will be contacted with the lab results as soon as they are available. The fastest way to get your results is to activate your My Chart account. Instructions are located on the last page of this paperwork. If you have not heard from us regarding the results in 2 weeks, please contact this office.      Knee Pain, Adult Many things can cause knee pain. The pain often goes away on its own with time and rest. If the pain does not go away, tests may be done to find out what is causing the pain. Follow these instructions at home: Activity  Rest your knee.  Do not do things that cause pain.  Avoid activities where both feet leave the ground at the same time (high-impact activities). Examples are running, jumping rope, and doing jumping jacks. General instructions  Take medicines only as told by your doctor.  Raise (elevate) your knee when you are resting. Make sure your knee is higher than your heart.  Sleep with a pillow under your knee.  If told, put ice on the knee: ? Put ice in a plastic bag. ? Place a towel between your skin and the bag. ? Leave the ice on for 20 minutes, 2-3 times a day.  Ask your doctor if you should wear an elastic knee support.  Lose weight if you are overweight. Being  overweight can make your knee hurt more.  Do not use any tobacco products. These include cigarettes, chewing tobacco, or electronic cigarettes. If you need help quitting, ask your doctor. Smoking may slow down healing. Contact a doctor if:  The pain does not stop.  The pain changes or gets worse.  You have a fever along with knee pain.  Your knee gives out or locks up.  Your knee swells, and becomes worse. Get help right away if:  Your knee feels warm.  You cannot move your knee.  You have very bad knee pain.  You have chest pain.  You have trouble breathing. Summary  Many things can cause knee pain. The pain often goes away on its own with time and rest.  Avoid activities that put stress on your knee. These include running and jumping rope.  Get help right away if you cannot move your knee, or if your knee feels warm, or if you have trouble breathing. This information is not intended to replace advice given to you by your health care provider. Make sure you discuss any questions you have with your health care provider. Document Released: 05/21/2008 Document Revised: 02/17/2016 Document Reviewed: 02/17/2016 Elsevier Interactive Patient Education  2017 Elsevier Inc.  

## 2017-11-21 NOTE — Progress Notes (Signed)
Larry Hayden 32 y.o.   Chief Complaint  Patient presents with  . Knee Pain    RIGHT per patient started yesterday about 6 pm    HISTORY OF PRESENT ILLNESS: This is a 32 y.o. male complaining of pain to right knee that started yesterday after keeping it in hyperflexed position for a little while.  HPI   Prior to Admission medications   Medication Sig Start Date End Date Taking? Authorizing Provider  [facility] D3 5000 units capsule TAKE 1 CAPSULE (5,000 UNITS TOTAL) BY MOUTH DAILY. 05/16/17  Yes Sherren Mocha, MD  Magnesium 400 MG CAPS Take by mouth daily.   Yes [provider]  omeprazole (PRILOSEC) 40 MG capsule Take 40 mg by mouth daily.   Yes [provider]    No Known Allergies  Patient Active Problem List   Diagnosis Date Noted  . Gastritis and gastroduodenitis 04/04/2017  . Episodic mood disorder (HCC) 01/15/2017  . BMI 34.0-34.9,adult 11/19/2016  . Prediabetes 09/17/2016  . Hyperparathyroidism (HCC) 12/17/2013  . Vitamin D deficiency 12/17/2013  . Hypercalcemia 06/04/2013  . Stiffness of hand joint 06/04/2013  . Smoker 05/11/2013  . Hyperlipidemia 03/30/2013  . Elevated BP without diagnosis of hypertension 02/16/2013  . Acne 02/16/2013    Past Medical History:  Diagnosis Date  . Acne   . History of hypercalcemia 2015  . Hyperactive gag reflex   . Hyperparathyroidism (HCC)   . Pilonidal sinus 04/2015  . Smoking 06/04/2013  . Tooth injury    right upper front - hx. of repair  . Vitamin D deficiency     Past Surgical History:  Procedure Laterality Date  . NO PAST SURGERIES    . PILONIDAL CYST EXCISION N/A 04/23/2015   Procedure: CYST EXCISION PILONIDAL EXTENSIVE;  Surgeon: Almond Lint, MD;  Location: [facility];  Service: General;  Laterality: N/A;    Social History   Socioeconomic History  . Marital status: Married    Spouse name: Not on file  . Number of children: 0  . Years of education: College  . Highest education  level: Not on file  Occupational History  . Occupation: Clinical biochemist: [employer]  Social Needs  . Financial resource strain: Not on file  . Food insecurity:    Worry: Not on file    Inability: Not on file  . Transportation needs:    Medical: Not on file    Non-medical: Not on file  Tobacco Use  . Smoking status: Former Smoker    Packs/day: 1.00    Years: 13.00    Pack years: 13.00    Types: Cigarettes    Last attempt to quit: 07/28/2015    Years since quitting: 2.3  . Smokeless tobacco: Never Used  Substance and Sexual Activity  . Alcohol use: No    Alcohol/week: 0.0 standard drinks    Frequency: Never  . Drug use: No  . Sexual activity: Not on file  Lifestyle  . Physical activity:    Days per week: Not on file    Minutes per session: Not on file  . Stress: Not on file  Relationships  . Social connections:    Talks on phone: Not on file    Gets together: Not on file    Attends religious service: Not on file    Active member of club or organization: Not on file    Attends meetings of clubs or organizations: Not on file  Relationship status: Not on file  . Intimate partner violence:    Fear of current or ex partner: Not on file    Emotionally abused: Not on file    Physically abused: Not on file    Forced sexual activity: Not on file  Other Topics Concern  . Not on file  Social History Narrative   Originally from [place].   [place] the [place] in [place].   Has a degree in Public relations account executiveengineering, and served in the [employer].   Married summer 2018. His wife lives in [place].   Lives with his sister.    Family History  Problem Relation Age of Onset  . Cancer Mother   . Heart disease Maternal Grandmother      ROS  Vitals:   11/21/17 1421  BP: 131/84  Pulse: 78  Resp: 16  Temp: 99 F (37.2 C)  SpO2: 96%    Physical Exam  Constitutional: He is oriented to person, place, and time. He appears well-developed and well-nourished.  HENT:  Head:  Normocephalic and atraumatic.  Eyes: Pupils are equal, round, and reactive to light. EOM are normal.  Neck: Normal range of motion.  Cardiovascular: Normal rate and regular rhythm.  Pulmonary/Chest: Effort normal.  Musculoskeletal:  Right knee: Mild swelling with medial tenderness and limited range of motion due to pain.  Otherwise unremarkable.  Neurological: He is alert and oriented to person, place, and time.  Skin: Skin is warm and dry. Capillary refill takes less than 2 seconds.  Psychiatric: He has a normal mood and affect. His behavior is normal.   Dg Knee Complete 4 Views Right  Result Date: 11/21/2017 CLINICAL DATA:  Acute right knee pain. EXAM: RIGHT KNEE - COMPLETE 4+ VIEW COMPARISON:  Radiographs of September 17, 2016. FINDINGS: No evidence of fracture, dislocation, or joint effusion. No evidence of arthropathy or other focal bone abnormality. Soft tissues are unremarkable. IMPRESSION: Normal right knee. Electronically Signed   By: Lupita RaiderJames  Green Jr, M.D.   On: 11/21/2017 15:01     ASSESSMENT & PLAN: Larry CaveFiras was seen today for knee pain.  Diagnoses and all orders for this visit:  Acute pain of right knee -     DG Knee Complete 4 Views Right; Future -     predniSONE (DELTASONE) 20 MG tablet; Take 2 tablets (40 mg total) by mouth daily with breakfast for 5 days. -     traMADol (ULTRAM) 50 MG tablet; Take 1 tablet (50 mg total) by mouth every 8 (eight) hours as needed.  Sprain of medial collateral ligament of right knee, initial encounter     Patient Instructions       If you have lab work done today you will be contacted with your lab results within the next 2 weeks.  If you have not heard from us then please contact us. The fastest way to get your results is to register for My Chart.   IF you received an x-ray today, you will receive an invoice from [facility]. Please contact [facility] at [phone] with questions or concerns regarding your invoice.     IF you received labwork today, you will receive an invoice from [facility]. Please contact [facility] at [phone] with questions or concerns regarding your invoice.   Our billing staff will not be able to assist you with questions regarding bills from these companies.  You will be contacted with the lab results as soon as they are available. The fastest way to get your  results is to activate your My Chart account. Instructions are located on the last page of this paperwork. If you have not heard from [place] regarding the results in 2 weeks, please contact this office.     Knee Pain, Adult Many things can cause knee pain. The pain often goes away on its own with time and rest. If the pain does not go away, tests may be done to find out what is causing the pain. Follow these instructions at home: Activity  Rest your knee.  Do not do things that cause pain.  Avoid activities where both feet leave the ground at the same time (high-impact activities). Examples are running, jumping rope, and doing jumping jacks. General instructions  Take medicines only as told by your doctor.  Raise (elevate) your knee when you are resting. Make sure your knee is higher than your heart.  Sleep with a pillow under your knee.  If told, put ice on the knee: ? Put ice in a plastic bag. ? Place a towel between your skin and the bag. ? Leave the ice on for 20 minutes, 2-3 times a day.  Ask your doctor if you should wear an elastic knee support.  Lose weight if you are overweight. Being overweight can make your knee hurt more.  Do not use any tobacco products. These include cigarettes, chewing tobacco, or electronic cigarettes. If you need help quitting, ask your doctor. Smoking may slow down healing. Contact a doctor if:  The pain does not stop.  The pain changes or gets worse.  You have a fever along with knee pain.  Your knee gives out or locks up.  Your knee swells, and becomes worse. Get help  right away if:  Your knee feels warm.  You cannot move your knee.  You have very bad knee pain.  You have chest pain.  You have trouble breathing. Summary  Many things can cause knee pain. The pain often goes away on its own with time and rest.  Avoid activities that put stress on your knee. These include running and jumping rope.  Get help right away if you cannot move your knee, or if your knee feels warm, or if you have trouble breathing. This information is not intended to replace advice given to you by your health care provider. Make sure you discuss any questions you have with your health care provider. Document Released: 05/21/2008 Document Revised: 02/17/2016 Document Reviewed: 02/17/2016 Elsevier Interactive Patient Education  2017 Elsevier Inc.      Edwina Barth, MD [facility]

## 2017-12-08 DIAGNOSIS — M25561 Pain in right knee: Secondary | ICD-10-CM | POA: Diagnosis not present

## 2017-12-08 DIAGNOSIS — Z79899 Other long term (current) drug therapy: Secondary | ICD-10-CM | POA: Diagnosis not present

## 2017-12-08 DIAGNOSIS — M129 Arthropathy, unspecified: Secondary | ICD-10-CM | POA: Diagnosis not present

## 2017-12-08 DIAGNOSIS — M79604 Pain in right leg: Secondary | ICD-10-CM | POA: Diagnosis not present

## 2017-12-08 DIAGNOSIS — M79605 Pain in left leg: Secondary | ICD-10-CM | POA: Diagnosis not present

## 2017-12-09 DIAGNOSIS — M25561 Pain in right knee: Secondary | ICD-10-CM | POA: Diagnosis not present

## 2017-12-15 ENCOUNTER — Other Ambulatory Visit: Payer: Self-pay | Admitting: Family Medicine

## 2017-12-15 DIAGNOSIS — M25561 Pain in right knee: Secondary | ICD-10-CM | POA: Diagnosis not present

## 2017-12-15 DIAGNOSIS — M79605 Pain in left leg: Secondary | ICD-10-CM | POA: Diagnosis not present

## 2017-12-15 DIAGNOSIS — M79604 Pain in right leg: Secondary | ICD-10-CM | POA: Diagnosis not present

## 2017-12-15 NOTE — Telephone Encounter (Signed)
Requested medication (s) are due for refill today:   Yes  Requested medication (s) are on the active medication list:   Yes  Future visit scheduled:   Yes  01/27/18 with Clelia Croft   Last ordered: 05/16/17  #90  1 refill     Requested Prescriptions  Pending Prescriptions Disp Refills   CVS D3 5000 units capsule [Pharmacy Med Name: CVS VITAMIN D3 5,000 UNIT SFGL] 90 capsule 1    Sig: TAKE 1 CAPSULE (5,000 UNITS TOTAL) BY MOUTH DAILY.     Endocrinology:  Vitamins - Vitamin D Supplementation Failed - 12/15/2017 12:51 PM      Failed - 50,000 IU strengths are not delegated      Failed - Ca in normal range and within 360 days    Calcium  Date Value Ref Range Status  10/05/2017 10.7 (H) 8.7 - 10.2 mg/dL Final         Failed - Vit D in normal range and within 360 days    Vitamin D2 1, 25 (OH)2  Date Value Ref Range Status  03/21/2017 <10 pg/mL Final   Vitamin D3 1, 25 (OH)2  Date Value Ref Range Status  03/21/2017 52 pg/mL Final   Vitamin D 1, 25 (OH)2 Total  Date Value Ref Range Status  03/21/2017 52 pg/mL Final    Comment:    Reference Range: Adults: 21 - 65    VITD  Date Value Ref Range Status  06/18/2014 15.54 (L) 30.00 - 100.00 ng/mL Final   Vit D, 25-Hydroxy  Date Value Ref Range Status  10/05/2017 26.8 (L) 30.0 - 100.0 ng/mL Final    Comment:    Vitamin D deficiency has been defined by the Institute of Medicine and an Endocrine Society practice guideline as a level of serum 25-OH vitamin D less than 20 ng/mL (1,2). The Endocrine Society went on to further define vitamin D insufficiency as a level between 21 and 29 ng/mL (2). 1. IOM (Institute of Medicine). 2010. Dietary reference    intakes for calcium and D. [place]: The    [employer]. 2. Holick MF, Binkley Cheraw, Bischoff-Ferrari HA, et al.    Evaluation, treatment, and prevention of vitamin D    deficiency: an Endocrine Society clinical practice    guideline. JCEM. 2011 Jul; 96(7):1911-30.         Passed - Phosphate in normal range and within 360 days    Phosphorus  Date Value Ref Range Status  10/05/2017 2.6 2.5 - 4.5 mg/dL Final         Passed - Valid encounter within last 12 months    Recent Outpatient Visits          3 weeks ago Acute pain of right knee   [facility], Eilleen Kempf, MD   2 months ago Elevated LFTs   [facility] at [facility], Levell July, MD   3 months ago Sore throat   [facility] at [facility], Sandria Bales, MD   3 months ago Strain of calf muscle, left, subsequent encounter   [facility] at [facility], Levell July, MD   3 months ago Pain of left calf   [facility] at [facility], Levell July, MD      Future Appointments            In 1 month Clelia Croft Levell July, MD [facility] at [place], [facility]

## 2018-01-05 ENCOUNTER — Ambulatory Visit: Payer: BLUE CROSS/BLUE SHIELD | Admitting: Family Medicine

## 2018-01-27 ENCOUNTER — Ambulatory Visit: Payer: BLUE CROSS/BLUE SHIELD | Admitting: Family Medicine

## 2018-03-03 ENCOUNTER — Ambulatory Visit: Payer: BLUE CROSS/BLUE SHIELD | Admitting: Family Medicine
